# Patient Record
Sex: Female | Born: 1995 | Race: White | Hispanic: No | Marital: Married | Smoking: Never smoker
Health system: Southern US, Community
[De-identification: ages and names within clinical notes are randomized; demographics above are authoritative.]

## PROBLEM LIST (undated history)

## (undated) DIAGNOSIS — F4310 Post-traumatic stress disorder, unspecified: Secondary | ICD-10-CM

## (undated) DIAGNOSIS — N83209 Unspecified ovarian cyst, unspecified side: Secondary | ICD-10-CM

## (undated) HISTORY — DX: Post-traumatic stress disorder, unspecified: F43.10

## (undated) HISTORY — PX: KNEE ARTHROSCOPY WITH ANTERIOR CRUCIATE LIGAMENT (ACL) REPAIR WITH HAMSTRING GRAFT: SHX5645

## (undated) HISTORY — PX: OTHER SURGICAL HISTORY: SHX169

---

## 2018-11-29 ENCOUNTER — Emergency Department
Admission: EM | Admit: 2018-11-29 | Discharge: 2018-11-29 | Disposition: A | Discharge status: Home / Self Care | Payer: BC Managed Care – PPO | Attending: Emergency Medicine | Admitting: Emergency Medicine

## 2018-11-29 ENCOUNTER — Emergency Department: Payer: BC Managed Care – PPO

## 2018-11-29 ENCOUNTER — Other Ambulatory Visit: Payer: Self-pay

## 2018-11-29 ENCOUNTER — Encounter: Payer: Self-pay | Admitting: Emergency Medicine

## 2018-11-29 DIAGNOSIS — O9989 Other specified diseases and conditions complicating pregnancy, childbirth and the puerperium: Secondary | ICD-10-CM | POA: Diagnosis not present

## 2018-11-29 DIAGNOSIS — N83202 Unspecified ovarian cyst, left side: Secondary | ICD-10-CM | POA: Insufficient documentation

## 2018-11-29 DIAGNOSIS — O200 Threatened abortion: Secondary | ICD-10-CM | POA: Diagnosis not present

## 2018-11-29 DIAGNOSIS — R109 Unspecified abdominal pain: Secondary | ICD-10-CM | POA: Insufficient documentation

## 2018-11-29 DIAGNOSIS — R102 Pelvic and perineal pain: Secondary | ICD-10-CM | POA: Diagnosis not present

## 2018-11-29 DIAGNOSIS — O3481 Maternal care for other abnormalities of pelvic organs, first trimester: Secondary | ICD-10-CM | POA: Diagnosis not present

## 2018-11-29 DIAGNOSIS — Z3A01 Less than 8 weeks gestation of pregnancy: Secondary | ICD-10-CM | POA: Diagnosis not present

## 2018-11-29 HISTORY — DX: Unspecified ovarian cyst, unspecified side: N83.209

## 2018-11-29 LAB — URINALYSIS, COMPLETE (UACMP) WITH MICROSCOPIC
Bacteria, UA: NONE SEEN
Bilirubin Urine: NEGATIVE
Glucose, UA: NEGATIVE mg/dL
Hgb urine dipstick: NEGATIVE
Ketones, ur: NEGATIVE mg/dL
Leukocytes,Ua: NEGATIVE
Nitrite: NEGATIVE
Protein, ur: 30 mg/dL — AB
Specific Gravity, Urine: 1.028 (ref 1.005–1.030)
pH: 7 (ref 5.0–8.0)

## 2018-11-29 LAB — COMPREHENSIVE METABOLIC PANEL
ALT: 17 U/L (ref 0–44)
AST: 19 U/L (ref 15–41)
Albumin: 4.3 g/dL (ref 3.5–5.0)
Alkaline Phosphatase: 69 U/L (ref 38–126)
Anion gap: 10 (ref 5–15)
BUN: 12 mg/dL (ref 6–20)
CO2: 24 mmol/L (ref 22–32)
Calcium: 9 mg/dL (ref 8.9–10.3)
Chloride: 103 mmol/L (ref 98–111)
Creatinine, Ser: 0.68 mg/dL (ref 0.44–1.00)
GFR calc Af Amer: >60 mL/min (ref >60)
GFR calc non Af Amer: >60 mL/min (ref >60)
Glucose, Bld: 101 mg/dL — ABNORMAL HIGH (ref 70–99)
Potassium: 4 mmol/L (ref 3.5–5.1)
Sodium: 137 mmol/L (ref 135–145)
Total Bilirubin: 1.1 mg/dL (ref 0.3–1.2)
Total Protein: 7.7 g/dL (ref 6.5–8.1)

## 2018-11-29 LAB — LIPASE, BLOOD: Lipase: 21 U/L (ref 11–51)

## 2018-11-29 LAB — CBC
HCT: 44.5 % (ref 36.0–46.0)
Hemoglobin: 14.6 g/dL (ref 12.0–15.0)
MCH: 26.9 pg (ref 26.0–34.0)
MCHC: 32.8 g/dL (ref 30.0–36.0)
MCV: 82 fL (ref 80.0–100.0)
Platelets: 280 10*3/uL (ref 150–400)
RBC: 5.43 MIL/uL — ABNORMAL HIGH (ref 3.87–5.11)
RDW: 12.1 % (ref 11.5–15.5)
WBC: 6.8 10*3/uL (ref 4.0–10.5)
nRBC: 0 % (ref 0.0–0.2)

## 2018-11-29 LAB — HCG, QUANTITATIVE, PREGNANCY: hCG, Beta Chain, Quant, S: 12 m[IU]/mL — ABNORMAL HIGH (ref <5)

## 2018-11-29 LAB — POCT PREGNANCY, URINE: Preg Test, Ur: NEGATIVE

## 2018-11-29 NOTE — ED Provider Notes (Signed)
[facility] Emergency Department Provider Note  ____________________________________________  Time seen: Approximately 5:08 PM  I have reviewed the triage vital signs and the nursing notes.   HISTORY  Chief Complaint Abdominal Pain   HPI Sarah Nichols is a 23 y.o. female no significant past medical history who presents for evaluation of abdominal pain.  Patient reports a mild dull left-sided abdominal pain that started yesterday.  Patient reports being currently [redacted] weeks pregnant.  She reports that she was not concerned about the pain but because she was pregnant and had not had an ultrasound she thought it was better to get it checked out.  She went to urgent care and was sent to the emergency room for further evaluation.  She denies dysuria, hematuria, fever, chills, cough, shortness of breath, chest pain, diarrhea, constipation, nausea or vomiting.   Past Medical History:  Diagnosis Date   Ovarian cyst     Allergies Patient has no known allergies.  No family history on file.  Social History Social History   Tobacco Use   Smoking status: Never Smoker   Smokeless tobacco: Never Used  Substance Use Topics   Alcohol use: Yes   Drug use: Not on file    Review of Systems  Constitutional: Negative for fever. Eyes: Negative for visual changes. ENT: Negative for sore throat. Neck: No neck pain  Cardiovascular: Negative for chest pain. Respiratory: Negative for shortness of breath. Gastrointestinal: + L sided abdominal pain, vomiting or diarrhea. Genitourinary: Negative for dysuria. Musculoskeletal: Negative for back pain. Skin: Negative for rash. Neurological: Negative for headaches, weakness or numbness. Psych: No SI or HI  ____________________________________________   PHYSICAL EXAM:  VITAL SIGNS: ED Triage Vitals  Enc Vitals Group     BP 11/29/18 1137 122/86     Pulse Rate 11/29/18 1137 77     Resp 11/29/18 1638 18      Temp 11/29/18 1137 98.1 F (36.7 C)     Temp Source 11/29/18 1137 Oral     SpO2 11/29/18 1137 97 %     Weight 11/29/18 1138 205 lb (93 kg)     Height 11/29/18 1138 5\' 4"  (1.626 m)     Head Circumference --      Peak Flow --      Pain Score 11/29/18 1138 4     Pain Loc --      Pain Edu? --      Excl. in GC? --     Constitutional: Alert and oriented. Well appearing and in no apparent distress. HEENT:      Head: Normocephalic and atraumatic.         Eyes: Conjunctivae are normal. Sclera is non-icteric.       Mouth/Throat: Mucous membranes are moist.       Neck: Supple with no signs of meningismus. Cardiovascular: Regular rate and rhythm. No murmurs, gallops, or rubs. 2+ symmetrical distal pulses are present in all extremities. No JVD. Respiratory: Normal respiratory effort. Lungs are clear to auscultation bilaterally. No wheezes, crackles, or rhonchi.  Gastrointestinal: Soft, non tender, and non distended with positive bowel sounds. No rebound or guarding. Genitourinary: No CVA tenderness. Musculoskeletal: Nontender with normal range of motion in all extremities. No edema, cyanosis, or erythema of extremities. Neurologic: Normal speech and language. Face is symmetric. Moving all extremities. No gross focal neurologic deficits are appreciated. Skin: Skin is warm, dry and intact. No rash noted. Psychiatric: Mood and affect are normal. Speech and behavior are normal.  ____________________________________________   LABS (all labs ordered are listed, but only abnormal results are displayed)  Labs Reviewed  COMPREHENSIVE METABOLIC PANEL - Abnormal; Notable for the following components:      Result Value   Glucose, Bld 101 (*)    All other components within normal limits  CBC - Abnormal; Notable for the following components:   RBC 5.43 (*)    All other components within normal limits  URINALYSIS, COMPLETE (UACMP) WITH MICROSCOPIC - Abnormal; Notable for the following components:     Color, Urine YELLOW (*)    APPearance CLEAR (*)    Protein, ur 30 (*)    All other components within normal limits  HCG, QUANTITATIVE, PREGNANCY - Abnormal; Notable for the following components:   hCG, Beta Chain, Quant, S 12 (*)    All other components within normal limits  LIPASE, BLOOD  POCT PREGNANCY, URINE  POC URINE PREG, ED   ____________________________________________  EKG  none  ____________________________________________  RADIOLOGY  I have personally reviewed the images performed during this visit and I agree with the Radiologist's read.   Interpretation by Radiologist:  [place] Ob Comp Less 14 Wks  Result Date: 11/29/2018 CLINICAL DATA:  Abdominal pain in first trimester of pregnancy, pain for 1 day; quantitative beta HCG = 12, with LMP 10/25/2018 EXAM: OBSTETRIC <14 WK US AND TRANSVAGINAL OB US TECHNIQUE: Both transabdominal and transvaginal ultrasound examinations were performed for complete evaluation of the gestation as well as the maternal uterus, adnexal regions, and pelvic cul-de-sac. Transvaginal technique was performed to assess early pregnancy. COMPARISON:  None for this gestation FINDINGS: Intrauterine gestational sac: Absent Yolk sac:  N/A Embryo:  N/A Cardiac Activity: N/A Heart Rate: N/A  bpm MSD:   mm    w     d CRL:    mm    w    d                  US EDC: Subchorionic hemorrhage:  N/A Maternal uterus/adnexae: RIGHT ovary normal size and morphology 2.8 x 1.4 x 1.9 cm LEFT ovary normal size and morphology 3.6 x 2.32.0 cm Small cyst identified within LEFT adnexa adjacent to LEFT ovary, question exophytic LEFT ovarian versus paraovarian cyst 8 x 8 x 8 mm containing a thin partial septation. Uterus normal appearance with endometrial complex 7 mm thick. No free fluid or additional adnexal masses. IMPRESSION: Question small exophytic LEFT ovarian versus paraovarian cyst 8 mm diameter. No intrauterine gestation identified. Findings are compatible with pregnancy of unknown  location. Differential diagnosis includes early intrauterine pregnancy too early to visualize, spontaneous abortion, and ectopic pregnancy. Serial quantitative beta hCG and or followup ultrasound recommended to definitively exclude ectopic pregnancy. Electronically Signed   By: Ulyses SouthwardMark  Boles M.D.   On: 11/29/2018 18:59   [place] Ob Transvaginal  Result Date: 11/29/2018 CLINICAL DATA:  Abdominal pain in first trimester of pregnancy, pain for 1 day; quantitative beta HCG = 12, with LMP 10/25/2018 EXAM: OBSTETRIC <14 WK US AND TRANSVAGINAL OB US TECHNIQUE: Both transabdominal and transvaginal ultrasound examinations were performed for complete evaluation of the gestation as well as the maternal uterus, adnexal regions, and pelvic cul-de-sac. Transvaginal technique was performed to assess early pregnancy. COMPARISON:  None for this gestation FINDINGS: Intrauterine gestational sac: Absent Yolk sac:  N/A Embryo:  N/A Cardiac Activity: N/A Heart Rate: N/A  bpm MSD:   mm    w     d CRL:    mm  w    d                  [place] EDC: Subchorionic hemorrhage:  N/A Maternal uterus/adnexae: RIGHT ovary normal size and morphology 2.8 x 1.4 x 1.9 cm LEFT ovary normal size and morphology 3.6 x 2.32.0 cm Small cyst identified within LEFT adnexa adjacent to LEFT ovary, question exophytic LEFT ovarian versus paraovarian cyst 8 x 8 x 8 mm containing a thin partial septation. Uterus normal appearance with endometrial complex 7 mm thick. No free fluid or additional adnexal masses. IMPRESSION: Question small exophytic LEFT ovarian versus paraovarian cyst 8 mm diameter. No intrauterine gestation identified. Findings are compatible with pregnancy of unknown location. Differential diagnosis includes early intrauterine pregnancy too early to visualize, spontaneous abortion, and ectopic pregnancy. Serial quantitative beta hCG and or followup ultrasound recommended to definitively exclude ectopic pregnancy. Electronically Signed   By: Ulyses Southward M.D.    On: 11/29/2018 18:59    ____________________________________________   PROCEDURES  Procedure(s) performed: None Procedures Critical Care performed:  None ____________________________________________   INITIAL IMPRESSION / ASSESSMENT AND PLAN / ED COURSE   23 y.o. female no significant past medical history who presents for evaluation of left sided mild abdominal soreness since last night.  No other associated symptoms.  Patient reports that she was concerned because she is [redacted] weeks pregnant and has not established care for this pregnancy yet.  This is her first pregnancy.  LMP was 5 weeks ago.  Here urine pregnancy test is negative.  We will send an hCG.  Labs are within normal limits, UA is clear, abdomen is soft with no tenderness at this time.  With no tenderness and normal labs low suspicion for appendicitis, ovarian pathology, gallbladder.  Clinical Course as of Nov 29 1907  Fri Nov 29, 2018  1906 hCG hormone of 12.  Transvaginal ultrasound shows no IUP.  I explained to the patient that she either had a miscarriage versus very early pregnancy of unknown location.  Therefore I explained to her that she needs repeat hCG hormones in 48 hours to determine if patient had a miscarriage or if she needs serial ultrasounds to make sure that this is a IUP versus an ectopic pregnancy.  Patient understands these recommendations.  Ultrasound also showed a left ovarian cyst which is known to patient with no free fluid or signs of torsion.  Patient remains with no tenderness on her abdomen and very mild pain.  Discussed my standard return precautions and close follow-up with.  Patient follows up at [facility].   [CV]    Clinical Course User Index [CV] Don Perking [place], MD     As part of my medical decision making, I reviewed the following data within the electronic MEDICAL RECORD NUMBER Nursing notes reviewed and incorporated, Labs reviewed , Old chart reviewed, Radiograph reviewed , Notes from  prior ED visits and [place] Controlled Substance Database    Pertinent labs & imaging results that were available during my care of the patient were reviewed by me and considered in my medical decision making (see chart for details).    ____________________________________________   FINAL CLINICAL IMPRESSION(S) / ED DIAGNOSES  Final diagnoses:  Threatened miscarriage      NEW MEDICATIONS STARTED DURING THIS VISIT:  ED Discharge Orders    None       Note:  This document was prepared using Dragon voice recognition software and may include unintentional dictation errors.    Don Perking, [place], MD 11/29/18  1909 ° °

## 2018-11-29 NOTE — ED Notes (Signed)
Report from jennifer, rn.  

## 2018-11-29 NOTE — Discharge Instructions (Addendum)
As explained to you, your ultrasound was unable to locate a pregnancy.  This is either because you had a miscarriage or your pregnancy is too early to be seen.  At this time we are unable to determine which 1 of the 2.  Therefore it is important they follow-up with your OB/GYN on Monday for repeat pregnancy hormones.  If the hormone level is 0 that means you had a miscarriage.  If it is going up you will need serial ultrasounds to make sure that you do not have an ectopic pregnancy.  Return to the emergency room if you have severe or new abdominal pain.  Otherwise follow-up with your OB/GYN on Monday.

## 2018-11-29 NOTE — ED Triage Notes (Signed)
Pt with left side abd pain that started last night. Pain described as achy. Pt states that nothing makes pain better or worse. Pt also approx [redacted] weeks pregnant, first pregnancy.

## 2019-02-11 ENCOUNTER — Telehealth: Payer: Self-pay

## 2019-02-11 NOTE — Telephone Encounter (Signed)
Coronavirus (COVID-19) Are you at risk?  Are you at risk for the Coronavirus (COVID-19)?  To be considered HIGH RISK for Coronavirus (COVID-19), you have to meet the following criteria:  . Traveled to [place], [place], [place], [place] or [place]; or in the [place] to [place], [place], [place], or [place]; and have fever, cough, and shortness of breath within the last 2 weeks of travel OR . Been in close contact with a person diagnosed with COVID-19 within the last 2 weeks and have fever, cough, and shortness of breath . IF YOU DO NOT MEET THESE CRITERIA, YOU ARE CONSIDERED LOW RISK FOR COVID-19.  What to do if you are HIGH RISK for COVID-19?  . If you are having a medical emergency, call 911. . Seek medical care right away. Before you go to a doctor's office, urgent care or emergency department, call ahead and tell them about your recent travel, contact with someone diagnosed with COVID-19, and your symptoms. You should receive instructions from your physician's office regarding next steps of care.  . When you arrive at healthcare provider, tell the healthcare staff immediately you have returned from visiting [place], [place], [place], [place] or [place]; or traveled in the [place] to [place], [place], [place], or [place]; in the last two weeks or you have been in close contact with a person diagnosed with COVID-19 in the last 2 weeks.   . Tell the health care staff about your symptoms: fever, cough and shortness of breath. . After you have been seen by a medical provider, you will be either: o Tested for (COVID-19) and discharged home on quarantine except to seek medical care if symptoms worsen, and asked to  - Stay home and avoid contact with others until you get your results (4-5 days)  - Avoid travel on public transportation if possible (such as bus, train, or airplane) or o Sent to the Emergency Department by EMS for evaluation, COVID-19 testing, and possible  admission depending on your condition and test results.  What to do if you are LOW RISK for COVID-19?  Reduce your risk of any infection by using the same precautions used for avoiding the common cold or flu:  . Wash your hands often with soap and warm water for at least 20 seconds.  If soap and water are not readily available, use an alcohol-based hand sanitizer with at least 60% alcohol.  . If coughing or sneezing, cover your mouth and nose by coughing or sneezing into the elbow areas of your shirt or coat, into a tissue or into your sleeve (not your hands). . Avoid shaking hands with others and consider head nods or verbal greetings only. . Avoid touching your eyes, nose, or mouth with unwashed hands.  . Avoid close contact with people who are Harriett Azar. . Avoid places or events with large numbers of people in one location, like concerts or sporting events. . Carefully consider travel plans you have or are making. . If you are planning any travel outside or inside the [place], visit the CDC's Travelers' Health webpage for the latest health notices. . If you have some symptoms but not all symptoms, continue to monitor at home and seek medical attention if your symptoms worsen. . If you are having a medical emergency, call 911.  02/11/19 SCREENING NEG SLS ADDITIONAL HEALTHCARE OPTIONS FOR PATIENTS  [facility] / e-Visit: [email or web address]         [facility]: [phone]    [facility]: [phone]                   [facility]: [phone]  

## 2019-02-12 ENCOUNTER — Encounter: Payer: Self-pay | Admitting: Certified Nurse Midwife

## 2019-02-12 ENCOUNTER — Other Ambulatory Visit: Payer: Self-pay

## 2019-02-12 ENCOUNTER — Ambulatory Visit: Payer: BC Managed Care – PPO | Admitting: Certified Nurse Midwife

## 2019-02-12 ENCOUNTER — Telehealth: Payer: Self-pay

## 2019-02-12 VITALS — BP 118/78 | HR 91 | Ht 64.0 in | Wt 206.3 lb

## 2019-02-12 DIAGNOSIS — N912 Amenorrhea, unspecified: Secondary | ICD-10-CM | POA: Diagnosis not present

## 2019-02-12 DIAGNOSIS — O09299 Supervision of pregnancy with other poor reproductive or obstetric history, unspecified trimester: Secondary | ICD-10-CM | POA: Diagnosis not present

## 2019-02-12 LAB — POCT URINE PREGNANCY: Preg Test, Ur: POSITIVE — AB

## 2019-02-12 MED ORDER — BONJESTA 20-20 MG PO TBCR: 1.0000 | EXTENDED_RELEASE_TABLET | Freq: Two times a day (BID) | ORAL | 4 refills | Status: DC

## 2019-02-12 NOTE — Telephone Encounter (Signed)
Informed patient her insurance company rejected the Center. She opted for the Vit B6 and Unisom to try.

## 2019-02-12 NOTE — Progress Notes (Signed)
Subjective:    Sarah Nichols is a 23 y.o. female who presents for evaluation of amenorrhea. She believes she could be pregnant. Pregnancy is desired. Sexual Activity: single partner, contraception: none. Current symptoms also include: breast tenderness and fatigue. Last period was normal.   Patient's last menstrual period was 12/28/2018 (exact date). The following portions of the patient's history were reviewed and updated as appropriate: allergies, current medications, past family history, past medical history, past social history, past surgical history and problem list.  Review of Systems Pertinent items are noted in HPI.     Objective:    BP 118/78   Pulse 91   Ht 5\' 4"  (1.626 m)   Wt 206 lb 5 oz (93.6 kg)   LMP 12/28/2018 (Exact Date)   BMI 35.41 kg/m  General: alert, cooperative, appears stated age and no acute distress    Lab Review Urine HCG: positive    Assessment:    Absence of menstruation.     Plan:   Positive: EDC: 10/04/19 Briefly discussed pre-natal care options. Pregnancy, Childbirth and Midwifery or MD care. Encouraged well-balanced diet, plenty of rest when needed, pre-natal vitamins daily and walking for exercise. Discussed self-help for nausea, avoiding OTC medications until consulting provider or pharmacist, other than Tylenol as needed, minimal caffeine (1-2 cups daily) and avoiding alcohol. She will schedule her u/s for dating 1 wk,  Initial nurse visit @ 10 wks and her NOB visit @ 12 wks .   Philip Aspen, CNM

## 2019-02-12 NOTE — Patient Instructions (Signed)

## 2019-02-26 ENCOUNTER — Other Ambulatory Visit: Payer: Self-pay

## 2019-02-26 ENCOUNTER — Ambulatory Visit (INDEPENDENT_AMBULATORY_CARE_PROVIDER_SITE_OTHER): Payer: BC Managed Care – PPO

## 2019-02-26 DIAGNOSIS — O3481 Maternal care for other abnormalities of pelvic organs, first trimester: Secondary | ICD-10-CM

## 2019-02-26 DIAGNOSIS — N8312 Corpus luteum cyst of left ovary: Secondary | ICD-10-CM | POA: Diagnosis not present

## 2019-02-26 DIAGNOSIS — Z3A01 Less than 8 weeks gestation of pregnancy: Secondary | ICD-10-CM

## 2019-02-26 DIAGNOSIS — N912 Amenorrhea, unspecified: Secondary | ICD-10-CM

## 2019-03-13 ENCOUNTER — Telehealth: Payer: Self-pay

## 2019-03-13 NOTE — Telephone Encounter (Signed)
Called pt advised her to put a pad on and let us know if bleeding continues

## 2019-03-13 NOTE — Telephone Encounter (Signed)
Patient used bathroom this morning and when she wiped she had light bleeding. [redacted] weeks pregnant concerned. No pain and no abdominal pressure or other symptoms. Please call her to Advise.

## 2019-03-14 ENCOUNTER — Ambulatory Visit (INDEPENDENT_AMBULATORY_CARE_PROVIDER_SITE_OTHER): Payer: BC Managed Care – PPO | Admitting: Obstetrics and Gynecology

## 2019-03-14 ENCOUNTER — Other Ambulatory Visit: Payer: Self-pay

## 2019-03-14 VITALS — BP 120/87 | HR 80 | Ht 64.0 in | Wt 207.8 lb

## 2019-03-14 DIAGNOSIS — Z3481 Encounter for supervision of other normal pregnancy, first trimester: Secondary | ICD-10-CM

## 2019-03-14 DIAGNOSIS — Z113 Encounter for screening for infections with a predominantly sexual mode of transmission: Secondary | ICD-10-CM

## 2019-03-14 DIAGNOSIS — Z0283 Encounter for blood-alcohol and blood-drug test: Secondary | ICD-10-CM

## 2019-03-14 DIAGNOSIS — R638 Other symptoms and signs concerning food and fluid intake: Secondary | ICD-10-CM

## 2019-03-14 NOTE — Progress Notes (Signed)
      Sarah Nichols presents for NOB nurse intake visit. Pregnancy confirmation done at [facility], 02/12/19, with Philip Aspen.  G2.  P0010.  LMP 12/28/18.  EDD 10/16/19.  [place]  [redacted]w[redacted]d . Pregnancy education material explained and given. 0 cats in the home.  NOB labs ordered. BMI greater than 30. TSH/HbgA1c ordered. Sickle cell not order due to race. HIV and drug screen explained and ordered. Genetic screening discussed. Genetic testing; Unsure. Pt to discuss genetic testing with provider. PNV encouraged. Pt to follow up with provider in  3 weeks for NOB physical. FMLA and [place] reviewed and signed.   Pt requested to see a MD instead of a midwife.  BP 120/87   Pulse 80   Ht 5\' 4"  (1.626 m)   Wt 207 lb 12.8 oz (94.3 kg)   LMP 12/28/2018 (Exact Date)   BMI 35.67 kg/m

## 2019-03-14 NOTE — Patient Instructions (Signed)
WHAT OB PATIENTS CAN EXPECT   Confirmation of pregnancy and ultrasound ordered if medically indicated-[redacted] weeks gestation  New OB (NOB) intake with nurse and New OB (NOB) labs- [redacted] weeks gestation  New OB (NOB) physical examination with provider- 11/[redacted] weeks gestation  Flu vaccine-[redacted] weeks gestation  Anatomy scan-[redacted] weeks gestation  Glucose tolerance test, blood work to test for anemia, T-dap vaccine-[redacted] weeks gestation  Vaginal swabs/cultures-STD/Group B strep-[redacted] weeks gestation  Appointments every 4 weeks until 28 weeks  Every 2 weeks from 28 weeks until 36 weeks  Weekly visits from 36 weeks until delivery  Morning Sickness  Morning sickness is when you feel sick to your stomach (nauseous) during pregnancy. You may feel sick to your stomach and throw up (vomit). You may feel sick in the morning, but you can feel this way at any time of day. Some women feel very sick to their stomach and cannot stop throwing up (hyperemesis gravidarum). Follow these instructions at home: Medicines  Take over-the-counter and prescription medicines only as told by your doctor. Do not take any medicines until you talk with your doctor about them first.  Taking multivitamins before getting pregnant can stop or lessen the harshness of morning sickness. Eating and drinking  Eat dry toast or crackers before getting out of bed.  Eat 5 or 6 small meals a day.  Eat dry and bland foods like rice and baked potatoes.  Do not eat greasy, fatty, or spicy foods.  Have someone cook for you if the smell of food causes you to feel sick or throw up.  If you feel sick to your stomach after taking prenatal vitamins, take them at night or with a snack.  Eat protein when you need a snack. Nuts, yogurt, and cheese are good choices.  Drink fluids throughout the day.  Try ginger ale made with real ginger, ginger tea made from fresh grated ginger, or ginger candies. General instructions  Do not use any products  that have nicotine or tobacco in them, such as cigarettes and e-cigarettes. If you need help quitting, ask your doctor.  Use an air purifier to keep the air in your house free of smells.  Get lots of fresh air.  Try to avoid smells that make you feel sick.  Try: ? Wearing a bracelet that is used for seasickness (acupressure wristband). ? Going to a doctor who puts thin needles into certain body points (acupuncture) to improve how you feel. Contact a doctor if:  You need medicine to feel better.  You feel dizzy or light-headed.  You are losing weight. Get help right away if:  You feel very sick to your stomach and cannot stop throwing up.  You pass out (faint).  You have very bad pain in your belly. Summary  Morning sickness is when you feel sick to your stomach (nauseous) during pregnancy.  You may feel sick in the morning, but you can feel this way at any time of day.  Making some changes to what you eat may help your symptoms go away. This information is not intended to replace advice given to you by your health care provider. Make sure you discuss any questions you have with your health care provider. Document Released: 08/24/2004 Document Revised: 06/29/2017 Document Reviewed: 08/17/2016 Elsevier Patient Education  2020 [employer]. How a Baby Grows During Pregnancy  Pregnancy begins when a female's sperm enters a female's egg (fertilization). Fertilization usually happens in one of the tubes (fallopian tubes) that connect the  ovaries to the womb (uterus). The fertilized egg moves down the fallopian tube to the uterus. Once it reaches the uterus, it implants into the lining of the uterus and begins to grow. For the first 10 weeks, the fertilized egg is called an embryo. After 10 weeks, it is called a fetus. As the fetus continues to grow, it receives oxygen and nutrients through tissue (placenta) that grows to support the developing baby. The placenta is the life support  system for the baby. It provides oxygen and nutrition and removes waste. Learning as much as you can about your pregnancy and how your baby is developing can help you enjoy the experience. It can also make you aware of when there might be a problem and when to ask questions. How long does a typical pregnancy last? A pregnancy usually lasts 280 days, or about 40 weeks. Pregnancy is divided into three periods of growth, also called trimesters:  First trimester: 0-12 weeks.  Second trimester: 13-27 weeks.  Third trimester: 28-40 weeks. The day when your baby is ready to be born (full term) is your estimated date of delivery. How does my baby develop month by month? First month  The fertilized egg attaches to the inside of the uterus.  Some cells will form the placenta. Others will form the fetus.  The arms, legs, brain, spinal cord, lungs, and heart begin to develop.  At the end of the first month, the heart begins to beat. Second month  The bones, inner ear, eyelids, hands, and feet form.  The genitals develop.  By the end of 8 weeks, all major organs are developing. Third month  All of the internal organs are forming.  Teeth develop below the gums.  Bones and muscles begin to grow. The spine can flex.  The skin is transparent.  Fingernails and toenails begin to form.  Arms and legs continue to grow longer, and hands and feet develop.  The fetus is about 3 inches (7.6 cm) long. Fourth month  The placenta is completely formed.  The external sex organs, neck, outer ear, eyebrows, eyelids, and fingernails are formed.  The fetus can hear, swallow, and move its arms and legs.  The kidneys begin to produce urine.  The skin is covered with a white, waxy coating (vernix) and very fine hair (lanugo). Fifth month  The fetus moves around more and can be felt for the first time (quickening).  The fetus starts to sleep and wake up and may begin to suck its finger.  The  nails grow to the end of the fingers.  The organ in the digestive system that makes bile (gallbladder) functions and helps to digest nutrients.  If your baby is a girl, eggs are present in her ovaries. If your baby is a boy, testicles start to move down into his scrotum. Sixth month  The lungs are formed.  The eyes open. The brain continues to develop.  Your baby has fingerprints and toe prints. Your baby's hair grows thicker.  At the end of the second trimester, the fetus is about 9 inches (22.9 cm) long. Seventh month  The fetus kicks and stretches.  The eyes are developed enough to sense changes in light.  The hands can make a grasping motion.  The fetus responds to sound. Eighth month  All organs and body systems are fully developed and functioning.  Bones harden, and taste buds develop. The fetus may hiccup.  Certain areas of the brain are still developing. The  skull remains soft. Ninth month  The fetus gains about  lb (0.23 kg) each week.  The lungs are fully developed.  Patterns of sleep develop.  The fetus's head typically moves into a head-down position (vertex) in the uterus to prepare for birth.  The fetus weighs 6-9 lb (2.72-4.08 kg) and is 19-20 inches (48.26-50.8 cm) long. What can I do to have a healthy pregnancy and help my baby develop? General instructions  Take prenatal vitamins as directed by your health care provider. These include vitamins such as folic acid, iron, calcium, and vitamin D. They are important for healthy development.  Take medicines only as directed by your health care provider. Read labels and ask a pharmacist or your health care provider whether over-the-counter medicines, supplements, and prescription drugs are safe to take during pregnancy.  Keep all follow-up visits as directed by your health care provider. This is important. Follow-up visits include prenatal care and screening tests. How do I know if my baby is developing  well? At each prenatal visit, your health care provider will do several different tests to check on your health and keep track of your baby's development. These include:  Fundal height and position. ? Your health care provider will measure your growing belly from your pubic bone to the top of the uterus using a tape measure. ? Your health care provider will also feel your belly to determine your baby's position.  Heartbeat. ? An ultrasound in the first trimester can confirm pregnancy and show a heartbeat, depending on how far along you are. ? Your health care provider will check your baby's heart rate at every prenatal visit.  Second trimester ultrasound. ? This ultrasound checks your baby's development. It also may show your baby's gender. What should I do if I have concerns about my baby's development? Always talk with your health care provider about any concerns that you may have about your pregnancy and your baby. Summary  A pregnancy usually lasts 280 days, or about 40 weeks. Pregnancy is divided into three periods of growth, also called trimesters.  Your health care provider will monitor your baby's growth and development throughout your pregnancy.  Follow your health care provider's recommendations about taking prenatal vitamins and medicines during your pregnancy.  Talk with your health care provider if you have any concerns about your pregnancy or your developing baby. This information is not intended to replace advice given to you by your health care provider. Make sure you discuss any questions you have with your health care provider. Document Released: 01/03/2008 Document Revised: 11/07/2018 Document Reviewed: 05/30/2017 Elsevier Patient Education  2020 [employer] of Pregnancy  The first trimester of pregnancy is from week 1 until the end of week 13 (months 1 through 3). During this time, your baby will begin to develop inside you. At 6-8 weeks, the eyes  and face are formed, and the heartbeat can be seen on ultrasound. At the end of 12 weeks, all the baby's organs are formed. Prenatal care is all the medical care you receive before the birth of your baby. Make sure you get good prenatal care and follow all of your doctor's instructions. Follow these instructions at home: Medicines  Take over-the-counter and prescription medicines only as told by your doctor. Some medicines are safe and some medicines are not safe during pregnancy.  Take a prenatal vitamin that contains at least 600 micrograms (mcg) of folic acid.  If you have trouble pooping (constipation), take medicine that  will make your stool soft (stool softener) if your doctor approves. Eating and drinking   Eat regular, healthy meals.  Your doctor will tell you the amount of weight gain that is right for you.  Avoid raw meat and uncooked cheese.  If you feel sick to your stomach (nauseous) or throw up (vomit): ? Eat 4 or 5 small meals a day instead of 3 large meals. ? Try eating a few soda crackers. ? Drink liquids between meals instead of during meals.  To prevent constipation: ? Eat foods that are high in fiber, like fresh fruits and vegetables, whole grains, and beans. ? Drink enough fluids to keep your pee (urine) clear or pale yellow. Activity  Exercise only as told by your doctor. Stop exercising if you have cramps or pain in your lower belly (abdomen) or low back.  Do not exercise if it is too hot, too humid, or if you are in a place of great height (high altitude).  Try to avoid standing for long periods of time. Move your legs often if you must stand in one place for a long time.  Avoid heavy lifting.  Wear low-heeled shoes. Sit and stand up straight.  You can have sex unless your doctor tells you not to. Relieving pain and discomfort  Wear a good support bra if your breasts are sore.  Take warm water baths (sitz baths) to soothe pain or discomfort caused by  hemorrhoids. Use hemorrhoid cream if your doctor says it is okay.  Rest with your legs raised if you have leg cramps or low back pain.  If you have puffy, bulging veins (varicose veins) in your legs: ? Wear support hose or compression stockings as told by your doctor. ? Raise (elevate) your feet for 15 minutes, 3-4 times a day. ? Limit salt in your food. Prenatal care  Schedule your prenatal visits by the twelfth week of pregnancy.  Write down your questions. Take them to your prenatal visits.  Keep all your prenatal visits as told by your doctor. This is important. Safety  Wear your seat belt at all times when driving.  Make a list of emergency phone numbers. The list should include numbers for family, friends, the hospital, and police and fire departments. General instructions  Ask your doctor for a referral to a local prenatal class. Begin classes no later than at the start of month 6 of your pregnancy.  Ask for help if you need counseling or if you need help with nutrition. Your doctor can give you advice or tell you where to go for help.  Do not use hot tubs, steam rooms, or saunas.  Do not douche or use tampons or scented sanitary pads.  Do not cross your legs for long periods of time.  Avoid all herbs and alcohol. Avoid drugs that are not approved by your doctor.  Do not use any tobacco products, including cigarettes, chewing tobacco, and electronic cigarettes. If you need help quitting, ask your doctor. You may get counseling or other support to help you quit.  Avoid cat litter boxes and soil used by cats. These carry germs that can cause birth defects in the baby and can cause a loss of your baby (miscarriage) or stillbirth.  Visit your dentist. At home, brush your teeth with a soft toothbrush. Be gentle when you floss. Contact a doctor if:  You are dizzy.  You have mild cramps or pressure in your lower belly.  You have a nagging pain  in your belly area.  You  continue to feel sick to your stomach, you throw up, or you have watery poop (diarrhea).  You have a bad smelling fluid coming from your vagina.  You have pain when you pee (urinate).  You have increased puffiness (swelling) in your face, hands, legs, or ankles. Get help right away if:  You have a fever.  You are leaking fluid from your vagina.  You have spotting or bleeding from your vagina.  You have very bad belly cramping or pain.  You gain or lose weight rapidly.  You throw up blood. It may look like coffee grounds.  You are around people who have [place] measles, fifth disease, or chickenpox.  You have a very bad headache.  You have shortness of breath.  You have any kind of trauma, such as from a fall or a car accident. Summary  The first trimester of pregnancy is from week 1 until the end of week 13 (months 1 through 3).  To take care of yourself and your unborn baby, you will need to eat healthy meals, take medicines only if your doctor tells you to do so, and do activities that are safe for you and your baby.  Keep all follow-up visits as told by your doctor. This is important as your doctor will have to ensure that your baby is healthy and growing well. This information is not intended to replace advice given to you by your health care provider. Make sure you discuss any questions you have with your health care provider. Document Released: 01/03/2008 Document Revised: 11/07/2018 Document Reviewed: 07/25/2016 Elsevier Patient Education  2020 [employer]. Commonly Asked Questions During Pregnancy  Cats: A parasite can be excreted in cat feces.  To avoid exposure you need to have another person empty the little box.  If you must empty the litter box you will need to wear gloves.  Wash your hands after handling your cat.  This parasite can also be found in raw or undercooked meat so this should also be avoided.  Colds, Sore Throats, Flu: Please check your medication  sheet to see what you can take for symptoms.  If your symptoms are unrelieved by these medications please call the office.  Dental Work: Most any dental work Investment banker, corporate recommends is permitted.  X-rays should only be taken during the first trimester if absolutely necessary.  Your abdomen should be shielded with a lead apron during all x-rays.  Please notify your provider prior to receiving any x-rays.  Novocaine is fine; gas is not recommended.  If your dentist requires a note from [place] prior to dental work please call the office and we will provide one for you.  Exercise: Exercise is an important part of staying healthy during your pregnancy.  You may continue most exercises you were accustomed to prior to pregnancy.  Later in your pregnancy you will most likely notice you have difficulty with activities requiring balance like riding a bicycle.  It is important that you listen to your body and avoid activities that put you at a higher risk of falling.  Adequate rest and staying well hydrated are a must!  If you have questions about the safety of specific activities ask your provider.    Exposure to Children with illness: Try to avoid obvious exposure; report any symptoms to [place] when noted,  If you have chicken pos, red measles or mumps, you should be immune to these diseases.   Please do not  take any vaccines while pregnant unless you have checked with your OB provider.  Fetal Movement: After 28 weeks we recommend you do "kick counts" twice daily.  Lie or sit down in a calm quiet environment and count your baby movements "kicks".  You should feel your baby at least 10 times per hour.  If you have not felt 10 kicks within the first hour get up, walk around and have something sweet to eat or drink then repeat for an additional hour.  If count remains less than 10 per hour notify your provider.  Fumigating: Follow your pest control agent's advice as to how long to stay out of your home.  Ventilate the area  well before re-entering.  Hemorrhoids:   Most over-the-counter preparations can be used during pregnancy.  Check your medication to see what is safe to use.  It is important to use a stool softener or fiber in your diet and to drink lots of liquids.  If hemorrhoids seem to be getting worse please call the office.   Hot Tubs:  Hot tubs Jacuzzis and saunas are not recommended while pregnant.  These increase your internal body temperature and should be avoided.  Intercourse:  Sexual intercourse is safe during pregnancy as long as you are comfortable, unless otherwise advised by your provider.  Spotting may occur after intercourse; report any bright red bleeding that is heavier than spotting.  Labor:  If you know that you are in labor, please go to the hospital.  If you are unsure, please call the office and let us help you decide what to do.  Lifting, straining, etc:  If your job requires heavy lifting or straining please check with your provider for any limitations.  Generally, you should not lift items heavier than that you can lift simply with your hands and arms (no back muscles)  Painting:  Paint fumes do not harm your pregnancy, but may make you ill and should be avoided if possible.  Latex or water based paints have less odor than oils.  Use adequate ventilation while painting.  Permanents & Hair Color:  Chemicals in hair dyes are not recommended as they cause increase hair dryness which can increase hair loss during pregnancy.  " Highlighting" and permanents are allowed.  Dye may be absorbed differently and permanents may not hold as well during pregnancy.  Sunbathing:  Use a sunscreen, as skin burns easily during pregnancy.  Drink plenty of fluids; avoid over heating.  Tanning Beds:  Because their possible side effects are still unknown, tanning beds are not recommended.  Ultrasound Scans:  Routine ultrasounds are performed at approximately 20 weeks.  You will be able to see your baby's  general anatomy an if you would like to know the gender this can usually be determined as well.  If it is questionable when you conceived you may also receive an ultrasound early in your pregnancy for dating purposes.  Otherwise ultrasound exams are not routinely performed unless there is a medical necessity.  Although you can request a scan we ask that you pay for it when conducted because insurance does not cover " patient request" scans.  Work: If your pregnancy proceeds without complications you may work until your due date, unless your physician or employer advises otherwise.  Round Ligament Pain/Pelvic Discomfort:  Sharp, shooting pains not associated with bleeding are fairly common, usually occurring in the second trimester of pregnancy.  They tend to be worse when standing up or when you remain  standing for long periods of time.  These are the result of pressure of certain pelvic ligaments called "round ligaments".  Rest, Tylenol and heat seem to be the most effective relief.  As the womb and fetus grow, they rise out of the pelvis and the discomfort improves.  Please notify the office if your pain seems different than that described.  It may represent a more serious condition.  Common Medications Safe in Pregnancy  Acne:      Constipation:  Benzoyl Peroxide     Colace  Clindamycin      Dulcolax Suppository  Topica Erythromycin     Fibercon  Salicylic Acid      Metamucil         Miralax AVOID:        Senakot   Accutane    Cough:  Retin-A       Cough Drops  Tetracycline      Phenergan w/ Codeine if Rx  Minocycline      Robitussin (Plain & DM)  Antibiotics:     Crabs/Lice:  Ceclor       RID  Cephalosporins    AVOID:  E-Mycins      Kwell  Keflex  Macrobid/Macrodantin   Diarrhea:  Penicillin      Kao-Pectate  Zithromax      Imodium AD         PUSH FLUIDS AVOID:       Cipro     Fever:  Tetracycline      Tylenol (Regular or Extra  Minocycline       Strength)  Levaquin      Extra  Strength-Do not          Exceed 8 tabs/24 hrs Caffeine:        <227m/day (equiv. To 1 cup of coffee or  approx. 3 12 oz sodas)         Gas: Cold/Hayfever:       Gas-X  Benadryl      Mylicon  Claritin       Phazyme  **Claritin-D        Chlor-Trimeton    Headaches:  Dimetapp      ASA-Free Excedrin  Drixoral-Non-Drowsy     Cold Compress  Mucinex (Guaifenasin)     Tylenol (Regular or Extra  Sudafed/Sudafed-12 Hour     Strength)  **Sudafed PE Pseudoephedrine   Tylenol Cold & Sinus     Vicks Vapor Rub  Zyrtec  **AVOID if Problems With Blood Pressure         Heartburn: Avoid lying down for at least 1 hour after meals  Aciphex      Maalox     Rash:  Milk of Magnesia     Benadryl    Mylanta       1% Hydrocortisone Cream  Pepcid  Pepcid Complete   Sleep Aids:  Prevacid      Ambien   Prilosec       Benadryl  Rolaids       Chamomile Tea  Tums (Limit 4/day)     Unisom  Zantac       Tylenol PM         Warm milk-add vanilla or  Hemorrhoids:       Sugar for taste  Anusol/Anusol H.C.  (RX: Analapram 2.5%)  Sugar Substitutes:  Hydrocortisone OTC     Ok in moderation  Preparation H      Tucks        Vaseline lotion  applied to tissue with wiping    Herpes:     Throat:  Acyclovir      Oragel  Famvir  Valtrex     Vaccines:         Flu Shot Leg Cramps:       *Gardasil  Benadryl      Hepatitis A         Hepatitis B Nasal Spray:       Pneumovax  Saline Nasal Spray     Polio Booster         Tetanus Nausea:       Tuberculosis test or PPD  Vitamin B6 25 mg TID   AVOID:    Dramamine      *Gardasil  Emetrol       Live Poliovirus  Ginger Root 250 mg QID    MMR (measles, mumps &  High Complex Carbs @ Bedtime    rebella)  Sea Bands-Accupressure    Varicella (Chickenpox)  Unisom 1/2 tab TID     *No known complications           If received before Pain:         Known pregnancy;   Darvocet       Resume series  after  Lortab        Delivery  Percocet    Yeast:   Tramadol      Femstat  Tylenol 3      Gyne-lotrimin  Ultram       Monistat  Vicodin           MISC:         All Sunscreens           Hair Coloring/highlights          Insect Repellant's          (Including DEET)         Mystic Tans Breast Self-Awareness Breast self-awareness is knowing how your breasts look and feel. Doing breast self-awareness is important. It allows you to catch a breast problem early while it is still small and can be treated. All women should do breast self-awareness, including women who have had breast implants. Tell your doctor if you notice a change in your breasts. What you need:  A mirror.  A well-lit room. How to do a breast self-exam A breast self-exam is one way to learn what is normal for your breasts and to check for changes. To do a breast self-exam: Look for changes  1. Take off all the clothes above your waist. 2. Stand in front of a mirror in a room with good lighting. 3. Put your hands on your hips. 4. Push your hands down. 5. Look at your breasts and nipples in the mirror to see if one breast or nipple looks different from the other. Check to see if: ? The shape of one breast is different. ? The size of one breast is different. ? There are wrinkles, dips, and bumps in one breast and not the other. 6. Look at each breast for changes in the skin, such as: ? Redness. ? Scaly areas. 7. Look for changes in your nipples, such as: ? Liquid around the nipples. ? Bleeding. ? Dimpling. ? Redness. ? A change in where the nipples are. Feel for changes  1. Lie on your back on the floor. 2. Feel each breast. To do this, follow these steps: ? Pick a breast to feel. ? Put the arm closest to that  breast above your head. ? Use your other arm to feel the nipple area of your breast. Feel the area with the pads of your three middle fingers by making small circles with your fingers. For the first circle,  press lightly. For the second circle, press harder. For the third circle, press even harder. ? Keep making circles with your fingers at the different pressures as you move down your breast. Stop when you feel your ribs. ? Move your fingers a little toward the center of your body. ? Start making circles with your fingers again, this time going up until you reach your collarbone. ? Keep making up-and-down circles until you reach your armpit. Remember to keep using the three pressures. ? Feel the other breast in the same way. 3. Sit or stand in the tub or shower. 4. With soapy water on your skin, feel each breast the same way you did in step 2 when you were lying on the floor. Write down what you find Writing down what you find can help you remember what to tell your doctor. Write down:  What is normal for each breast.  Any changes you find in each breast, including: ? The kind of changes you find. ? Whether you have pain. ? Size and location of any lumps.  When you last had your menstrual period. General tips  Check your breasts every month.  If you are breastfeeding, the best time to check your breasts is after you feed your baby or after you use a breast pump.  If you get menstrual periods, the best time to check your breasts is 5-7 days after your menstrual period is over.  With time, you will become comfortable with the self-exam, and you will begin to know if there are changes in your breasts. Contact a doctor if you:  See a change in the shape or size of your breasts or nipples.  See a change in the skin of your breast or nipples, such as red or scaly skin.  Have fluid coming from your nipples that is not normal.  Find a lump or thick area that was not there before.  Have pain in your breasts.  Have any concerns about your breast health. Summary  Breast self-awareness includes looking for changes in your breasts, as well as feeling for changes within your  breasts.  Breast self-awareness should be done in front of a mirror in a well-lit room.  You should check your breasts every month. If you get menstrual periods, the best time to check your breasts is 5-7 days after your menstrual period is over.  Let your doctor know of any changes you see in your breasts, including changes in size, changes on the skin, pain or tenderness, or fluid from your nipples that is not normal. This information is not intended to replace advice given to you by your health care provider. Make sure you discuss any questions you have with your health care provider. Document Released: 01/03/2008 Document Revised: 03/05/2018 Document Reviewed: 03/05/2018 Elsevier Patient Education  2020 [employer].

## 2019-03-17 ENCOUNTER — Other Ambulatory Visit: Payer: BC Managed Care – PPO

## 2019-03-18 ENCOUNTER — Other Ambulatory Visit: Payer: BC Managed Care – PPO

## 2019-03-18 ENCOUNTER — Telehealth: Payer: Self-pay | Admitting: Obstetrics and Gynecology

## 2019-03-18 ENCOUNTER — Other Ambulatory Visit: Payer: Self-pay

## 2019-03-18 NOTE — Telephone Encounter (Signed)
Patient called stating she is experiencing light spotting again this morning. She would like a call back. Thanks

## 2019-03-18 NOTE — Telephone Encounter (Signed)
Pt states she has had spotting on/off since Thursday. Today she had bright bleeding x 1. Has a hx of miscarriage. Pt request to be seen asap.   appt made with DJE 8/19.

## 2019-03-19 ENCOUNTER — Other Ambulatory Visit: Payer: Self-pay

## 2019-03-19 ENCOUNTER — Ambulatory Visit (INDEPENDENT_AMBULATORY_CARE_PROVIDER_SITE_OTHER): Payer: BC Managed Care – PPO | Admitting: Obstetrics and Gynecology

## 2019-03-19 ENCOUNTER — Encounter: Payer: Self-pay | Admitting: Obstetrics and Gynecology

## 2019-03-19 ENCOUNTER — Other Ambulatory Visit (INDEPENDENT_AMBULATORY_CARE_PROVIDER_SITE_OTHER): Payer: BC Managed Care – PPO

## 2019-03-19 VITALS — BP 125/78 | HR 94 | Wt 206.0 lb

## 2019-03-19 DIAGNOSIS — O200 Threatened abortion: Secondary | ICD-10-CM | POA: Diagnosis not present

## 2019-03-19 DIAGNOSIS — Z3A01 Less than 8 weeks gestation of pregnancy: Secondary | ICD-10-CM

## 2019-03-19 DIAGNOSIS — O209 Hemorrhage in early pregnancy, unspecified: Secondary | ICD-10-CM | POA: Diagnosis not present

## 2019-03-19 LAB — DRUG PROFILE, UR, 9 DRUGS (LABCORP)
Amphetamines, Urine: NEGATIVE ng/mL
Barbiturate Quant, Ur: NEGATIVE ng/mL
Benzodiazepine Quant, Ur: NEGATIVE ng/mL
Cannabinoid Quant, Ur: NEGATIVE ng/mL
Cocaine (Metab.): NEGATIVE ng/mL
Methadone Screen, Urine: NEGATIVE ng/mL
Opiate Quant, Ur: NEGATIVE ng/mL
PCP Quant, Ur: NEGATIVE ng/mL
Propoxyphene: NEGATIVE ng/mL

## 2019-03-19 LAB — POCT URINALYSIS DIPSTICK OB
Bilirubin, UA: NEGATIVE
Blood, UA: NEGATIVE
Glucose, UA: NEGATIVE
Ketones, UA: NEGATIVE
Nitrite, UA: NEGATIVE
Odor: NEGATIVE
POC,PROTEIN,UA: NEGATIVE
Spec Grav, UA: 1.02 (ref 1.010–1.025)
Urobilinogen, UA: 0.2 E.U./dL
pH, UA: 6 (ref 5.0–8.0)

## 2019-03-19 LAB — NICOTINE SCREEN, URINE: Cotinine Ql Scrn, Ur: NEGATIVE ng/mL

## 2019-03-19 NOTE — Progress Notes (Signed)
HPI:      [name] is a 23 y.o. G2P0010 who LMP was Patient's last menstrual period was 12/28/2018 (exact date).  Subjective:   She presents today with complaint of 2 episodes of spotting in the last week.  Her last pregnancy ended in miscarriage and she is very concerned this could be another miscarriage.  She has had intercourse during the first trimester but she states it is not related to the bleeding-several days apart.  She denies cramping.    Hx: The following portions of the patient's history were reviewed and updated as appropriate:             She  has a past medical history of Ovarian cyst and PTSD (post-traumatic stress disorder). She does not have any pertinent problems on file. She  has a past surgical history that includes no history. Her family history includes Cervical cancer in her mother; Gallstones in her paternal grandmother; Hypertension in her father. She  reports that she has never smoked. She has never used smokeless tobacco. She reports previous alcohol use. She reports previous drug use. She has a current medication list which includes the following prescription(s): acetaminophen, bonjesta, and prenatal multivitamin. She has No Known Allergies.       Review of Systems:  Review of Systems  Constitutional: Denied constitutional symptoms, night sweats, recent illness, fatigue, fever, insomnia and weight loss.  Eyes: Denied eye symptoms, eye pain, photophobia, vision change and visual disturbance.  Ears/Nose/Throat/Neck: Denied ear, nose, throat or neck symptoms, hearing loss, nasal discharge, sinus congestion and sore throat.  Cardiovascular: Denied cardiovascular symptoms, arrhythmia, chest pain/pressure, edema, exercise intolerance, orthopnea and palpitations.  Respiratory: Denied pulmonary symptoms, asthma, pleuritic pain, productive sputum, cough, dyspnea and wheezing.  Gastrointestinal: Denied, gastro-esophageal reflux, melena, nausea and  vomiting.  Genitourinary: See HPI for additional information.  Musculoskeletal: Denied musculoskeletal symptoms, stiffness, swelling, muscle weakness and myalgia.  Dermatologic: Denied dermatology symptoms, rash and scar.  Neurologic: Denied neurology symptoms, dizziness, headache, neck pain and syncope.  Psychiatric: Denied psychiatric symptoms, anxiety and depression.  Endocrine: Denied endocrine symptoms including hot flashes and night sweats.   Meds:   Current Outpatient Medications on File Prior to Visit  Medication Sig Dispense Refill  . acetaminophen (TYLENOL) 325 MG tablet Take 650 mg by mouth every 6 (six) hours as needed.    . Doxylamine-Pyridoxine ER (BONJESTA) 20-20 MG TBCR Take 1 capsule by mouth 2 (two) times daily. 62 tablet 4  . Prenatal Vit-Fe Fumarate-FA (PRENATAL MULTIVITAMIN) TABS tablet Take 1 tablet by mouth daily at 12 noon.     No current facility-administered medications on file prior to visit.     Objective:     Vitals:   03/19/19 1453  BP: 125/78  Pulse: 94                Assessment:    G2P0010 Patient Active Problem List   Diagnosis Date Noted  . History of miscarriage, currently pregnant 02/12/2019     1. Bleeding in early pregnancy   2. Threatened abortion        Plan:            1.  Ultrasound to confirm fetal viability.  2.  Type and Rh pending.  SAb I have discussed the possibility of miscarriage with the patient.  I have informed her that vaginal bleeding, cramping or passage of tissue are the most common signs of miscarriage.  Should she have heavy bleeding, pass tissue or have  other problems, she has been informed to call the office immediately.  I have advised her to remain at pelvic rest for at least one week after her last episode of bleeding.  If she is currently working, I have instructed her to discontinue until this situation resolves.  Complete versus incomplete miscarriage was discussed and the patient is aware that  should she develop heavy bleeding, fever, or persistent crampy pelvic pain she may require a D & E to remove the remaining portion of the miscarried pregnancy.  I advised her to keep me informed should her condition change.  Orders Orders Placed This Encounter  Procedures  . US OB Comp Less 14 Wks  . POC Urinalysis Dipstick OB    No orders of the defined types were placed in this encounter.     F/U  No follow-ups on file. I spent 31 minutes involved in the care of this patient of which greater than 50% was spent discussing for trimester bleeding, miscarriage, viable pregnancy, work-up and possible treatments and the section below labeled as addendum.  All questions answered.  Finis Bud, M.D. 03/19/2019 3:24 PM  Addendum:  Patient had her ultrasound and returned to discuss the findings.  Findings at ultrasound revealed a crown-rump length of 7 weeks with no fetal heart tones present.  (Missed AB)  I have discussed these findings with her in detail.  I have given her the options of expectant management, medical miscarriage at home, and D&E.  The risks and benefits of each were discussed in detail.  All of her questions were answered.  She would like time to discuss this with her husband and make a final decision.

## 2019-03-19 NOTE — Progress Notes (Signed)
Early ob- spotting and one episode of bright red x 4 days. NO cramps. NO ic recently. BM- wnl. HX of miscarriage. 11/2018-

## 2019-03-20 ENCOUNTER — Ambulatory Visit (INDEPENDENT_AMBULATORY_CARE_PROVIDER_SITE_OTHER): Payer: BC Managed Care – PPO | Admitting: Obstetrics and Gynecology

## 2019-03-20 ENCOUNTER — Other Ambulatory Visit
Admission: RE | Admit: 2019-03-20 | Discharge: 2019-03-20 | Disposition: A | Discharge status: Home / Self Care | Payer: BC Managed Care – PPO | Source: Ambulatory Visit | Attending: Obstetrics and Gynecology | Admitting: Obstetrics and Gynecology

## 2019-03-20 ENCOUNTER — Encounter: Payer: Self-pay | Admitting: Obstetrics and Gynecology

## 2019-03-20 VITALS — BP 106/73 | HR 87 | Ht 64.0 in | Wt 205.4 lb

## 2019-03-20 DIAGNOSIS — Z01818 Encounter for other preprocedural examination: Secondary | ICD-10-CM

## 2019-03-20 DIAGNOSIS — O021 Missed abortion: Secondary | ICD-10-CM | POA: Insufficient documentation

## 2019-03-20 DIAGNOSIS — Z3A01 Less than 8 weeks gestation of pregnancy: Secondary | ICD-10-CM

## 2019-03-20 DIAGNOSIS — Z20828 Contact with and (suspected) exposure to other viral communicable diseases: Secondary | ICD-10-CM | POA: Diagnosis not present

## 2019-03-20 DIAGNOSIS — Z01812 Encounter for preprocedural laboratory examination: Secondary | ICD-10-CM | POA: Insufficient documentation

## 2019-03-20 LAB — RPR: RPR Ser Ql: NONREACTIVE

## 2019-03-20 LAB — VARICELLA ZOSTER ANTIBODY, IGG: Varicella zoster IgG: <135 index — ABNORMAL LOW (ref >165)

## 2019-03-20 LAB — RUBELLA SCREEN: Rubella Antibodies, IGG: 8.47 index (ref >0.99)

## 2019-03-20 LAB — URINALYSIS, ROUTINE W REFLEX MICROSCOPIC
Bilirubin, UA: NEGATIVE
Glucose, UA: NEGATIVE
Ketones, UA: NEGATIVE
Leukocytes,UA: NEGATIVE
Nitrite, UA: NEGATIVE
Protein,UA: NEGATIVE
RBC, UA: NEGATIVE
Specific Gravity, UA: 1.009 (ref 1.005–1.030)
Urobilinogen, Ur: 0.2 mg/dL (ref 0.2–1.0)
pH, UA: 6 (ref 5.0–7.5)

## 2019-03-20 LAB — TOXOPLASMA ANTIBODIES- IGG AND  IGM
Toxoplasma Antibody- IgM: <3 AU/mL (ref 0.0–7.9)
Toxoplasma IgG Ratio: <3 IU/mL (ref 0.0–7.1)

## 2019-03-20 LAB — HEMOGLOBIN A1C
Est. average glucose Bld gHb Est-mCnc: 100 mg/dL
Hgb A1c MFr Bld: 5.1 % (ref 4.8–5.6)

## 2019-03-20 LAB — CULTURE, OB URINE

## 2019-03-20 LAB — HEPATITIS B SURFACE ANTIGEN: Hepatitis B Surface Ag: NEGATIVE

## 2019-03-20 LAB — TSH: TSH: 2.36 u[IU]/mL (ref 0.450–4.500)

## 2019-03-20 LAB — ABO AND RH: Rh Factor: NEGATIVE

## 2019-03-20 LAB — HIV ANTIBODY (ROUTINE TESTING W REFLEX): HIV Screen 4th Generation wRfx: NONREACTIVE

## 2019-03-20 LAB — ANTIBODY SCREEN: Antibody Screen: NEGATIVE

## 2019-03-20 LAB — URINE CULTURE, OB REFLEX

## 2019-03-20 MED ORDER — RHO D IMMUNE GLOBULIN 1500 UNITS IM SOSY
1500.0000 [IU] | PREFILLED_SYRINGE | Freq: Once | INTRAMUSCULAR | Status: AC
  Administered 2019-03-20: 1500 [IU] via INTRAMUSCULAR

## 2019-03-20 NOTE — H&P (View-Only) (Signed)
      PRE-OPERATIVE HISTORY AND PHYSICAL EXAM  PCP:  [facility] Subjective:   HPI:  Sarah Nichols is a 23 y.o. G2P0010.  Patient's last menstrual period was 12/28/2018 (exact date).  She presents today for a pre-op discussion and PE.  She has the following symptoms: She has decided that she would like a D&E for management of missed AB.  Review of Systems:   Constitutional: Denied constitutional symptoms, night sweats, recent illness, fatigue, fever, insomnia and weight loss.  Eyes: Denied eye symptoms, eye pain, photophobia, vision change and visual disturbance.  Ears/Nose/Throat/Neck: Denied ear, nose, throat or neck symptoms, hearing loss, nasal discharge, sinus congestion and sore throat.  Cardiovascular: Denied cardiovascular symptoms, arrhythmia, chest pain/pressure, edema, exercise intolerance, orthopnea and palpitations.  Respiratory: Denied pulmonary symptoms, asthma, pleuritic pain, productive sputum, cough, dyspnea and wheezing.  Gastrointestinal: Denied, gastro-esophageal reflux, melena, nausea and vomiting.  Genitourinary:  Small amount of vaginal bleeding  Musculoskeletal: Denied musculoskeletal symptoms, stiffness, swelling, muscle weakness and myalgia.  Dermatologic: Denied dermatology symptoms, rash and scar.  Neurologic: Denied neurology symptoms, dizziness, headache, neck pain and syncope.  Psychiatric: Denied psychiatric symptoms, anxiety and depression.  Endocrine: Denied endocrine symptoms including hot flashes and night sweats.   OB History  Gravida Para Term Preterm AB Living  2       1    SAB TAB Ectopic Multiple Live Births  1            # Outcome Date GA Lbr Len/2nd Weight Sex Delivery Anes PTL Lv  2 Current           1 SAB 11/2018            Past Medical History:  Diagnosis Date  . Ovarian cyst   . PTSD (post-traumatic stress disorder)     Past Surgical History:  Procedure Laterality Date  . no history        SOCIAL  HISTORY: Social History   Tobacco Use  Smoking Status Never Smoker  Smokeless Tobacco Never Used   Social History   Substance and Sexual Activity  Alcohol Use Not Currently   Social History   Substance and Sexual Activity  Drug Use Not Currently    Family History  Problem Relation Age of Onset  . Cervical cancer Mother   . Hypertension Father   . Gallstones Paternal Grandmother     ALLERGIES:  Patient has no known allergies.  MEDS:   Current Outpatient Medications on File Prior to Visit  Medication Sig Dispense Refill  . Doxylamine-Pyridoxine ER (BONJESTA) 20-20 MG TBCR Take 1 capsule by mouth 2 (two) times daily. (Patient not taking: Reported on 03/20/2019) 62 tablet 4   No current facility-administered medications on file prior to visit.     Meds ordered this encounter  Medications  . Rho D Immune Globulin SOSY 1,500 Units     Physical examination BP 106/73   Pulse 87   Ht 5' 4" (1.626 m)   Wt 205 lb 7 oz (93.2 kg)   LMP 12/28/2018 (Exact Date)   BMI 35.26 kg/m   General NAD, Conversant  HEENT Atraumatic; Op clear with mmm.  Normo-cephalic. Pupils reactive. Anicteric sclerae  Thyroid/Neck Smooth without nodularity or enlargement. Normal ROM.  Neck Supple.  Skin No rashes, lesions or ulceration. Normal palpated skin turgor. No nodularity.  Breasts: No masses or discharge.  Symmetric.  No axillary adenopathy.  Lungs: Clear to auscultation.No rales or wheezes. Normal Respiratory effort, no retractions.    Heart: NSR.  No murmurs or rubs appreciated. No periferal edema  Abdomen: Soft.  Non-tender.  No masses.  No HSM. No hernia  Extremities: Moves all appropriately.  Normal ROM for age. No lymphadenopathy.  Neuro: Oriented to PPT.  Normal mood. Normal affect.     Pelvic:  Deferred to the OR at the time of surgery.    Ultrasound results reveal fetal pole without fetal heart tones approximately [redacted] weeks gestation.   Assessment:   G2P0010 Patient Active  Problem List   Diagnosis Date Noted  . History of miscarriage, currently pregnant 02/12/2019    1. Preop examination   2. Missed ab      Plan:   Orders: Meds ordered this encounter  Medications  . Rho D Immune Globulin SOSY 1,500 Units     1.  D&E 2.  RhoGam given today  Pre-op discussions regarding Risks and Benefits of her scheduled surgery.  D&E The procedure and the risks and benefits of dilation and curettage/evacuation have been explained to the patient.  The specific risks of bleeding, infection, anesthesia, uterine perforation, and damage to bowel or bladder  have been specifically discussed.  I have answered all of her questions and I believe that she has an adequate and informed understanding of this procedure.

## 2019-03-20 NOTE — Progress Notes (Signed)
PRE-OPERATIVE HISTORY AND PHYSICAL EXAM  PCP:  [facility] Subjective:   HPI:  Sarah Nichols is a 23 y.o. G2P0010.  Patient's last menstrual period was 12/28/2018 (exact date).  She presents today for a pre-op discussion and PE.  She has the following symptoms: She has decided that she would like a D&E for management of missed AB.  Review of Systems:   Constitutional: Denied constitutional symptoms, night sweats, recent illness, fatigue, fever, insomnia and weight loss.  Eyes: Denied eye symptoms, eye pain, photophobia, vision change and visual disturbance.  Ears/Nose/Throat/Neck: Denied ear, nose, throat or neck symptoms, hearing loss, nasal discharge, sinus congestion and sore throat.  Cardiovascular: Denied cardiovascular symptoms, arrhythmia, chest pain/pressure, edema, exercise intolerance, orthopnea and palpitations.  Respiratory: Denied pulmonary symptoms, asthma, pleuritic pain, productive sputum, cough, dyspnea and wheezing.  Gastrointestinal: Denied, gastro-esophageal reflux, melena, nausea and vomiting.  Genitourinary:  Small amount of vaginal bleeding  Musculoskeletal: Denied musculoskeletal symptoms, stiffness, swelling, muscle weakness and myalgia.  Dermatologic: Denied dermatology symptoms, rash and scar.  Neurologic: Denied neurology symptoms, dizziness, headache, neck pain and syncope.  Psychiatric: Denied psychiatric symptoms, anxiety and depression.  Endocrine: Denied endocrine symptoms including hot flashes and night sweats.   OB History  Gravida Para Term Preterm AB Living  2       1    SAB TAB Ectopic Multiple Live Births  1            # Outcome Date GA Lbr Len/2nd Weight Sex Delivery Anes PTL Lv  2 Current           1 SAB 11/2018            Past Medical History:  Diagnosis Date  . Ovarian cyst   . PTSD (post-traumatic stress disorder)     Past Surgical History:  Procedure Laterality Date  . no history        SOCIAL  HISTORY: Social History   Tobacco Use  Smoking Status Never Smoker  Smokeless Tobacco Never Used   Social History   Substance and Sexual Activity  Alcohol Use Not Currently   Social History   Substance and Sexual Activity  Drug Use Not Currently    Family History  Problem Relation Age of Onset  . Cervical cancer Mother   . Hypertension Father   . Gallstones Paternal Grandmother     ALLERGIES:  Patient has no known allergies.  MEDS:   Current Outpatient Medications on File Prior to Visit  Medication Sig Dispense Refill  . Doxylamine-Pyridoxine ER (BONJESTA) 20-20 MG TBCR Take 1 capsule by mouth 2 (two) times daily. (Patient not taking: Reported on 03/20/2019) 62 tablet 4   No current facility-administered medications on file prior to visit.     Meds ordered this encounter  Medications  . Rho D Immune Globulin SOSY 1,500 Units     Physical examination BP 106/73   Pulse 87   Ht 5\' 4"  (1.626 m)   Wt 205 lb 7 oz (93.2 kg)   LMP 12/28/2018 (Exact Date)   BMI 35.26 kg/m   General NAD, Conversant  HEENT Atraumatic; Op clear with mmm.  Normo-cephalic. Pupils reactive. Anicteric sclerae  Thyroid/Neck Smooth without nodularity or enlargement. Normal ROM.  Neck Supple.  Skin No rashes, lesions or ulceration. Normal palpated skin turgor. No nodularity.  Breasts: No masses or discharge.  Symmetric.  No axillary adenopathy.  Lungs: Clear to auscultation.No rales or wheezes. Normal Respiratory effort, no retractions.  Heart:  NSR.  No murmurs or rubs appreciated. No periferal edema  Abdomen: Soft.  Non-tender.  No masses.  No HSM. No hernia  Extremities: Moves all appropriately.  Normal ROM for age. No lymphadenopathy.  Neuro: Oriented to PPT.  Normal mood. Normal affect.     Pelvic:  Deferred to the OR at the time of surgery.    Ultrasound results reveal fetal pole without fetal heart tones approximately [redacted] weeks gestation.   Assessment:   G2P0010 Patient Active  Problem List   Diagnosis Date Noted  . History of miscarriage, currently pregnant 02/12/2019    1. Preop examination   2. Missed ab      Plan:   Orders: Meds ordered this encounter  Medications  . Rho D Immune Globulin SOSY 1,500 Units     1.  D&E 2.  RhoGam given today  Pre-op discussions regarding Risks and Benefits of her scheduled surgery.  D&E The procedure and the risks and benefits of dilation and curettage/evacuation have been explained to the patient.  The specific risks of bleeding, infection, anesthesia, uterine perforation, and damage to bowel or bladder  have been specifically discussed.  I have answered all of her questions and I believe that she has an adequate and informed understanding of this procedure.  I spent 32 minutes involved in the care of this patient of which greater than 50% was spent discussing D&E, causes of miscarriage, future pregnancies, risks and benefits of surgery, other options including misoprostol, necessity of RhoGam.  All questions answered  Elonda Huskyavid J. Evans, M.D. 03/20/2019 4:09 PM

## 2019-03-20 NOTE — H&P (Signed)
PRE-OPERATIVE HISTORY AND PHYSICAL EXAM  [facility]:  [facility] Subjective:   HPI:  Sarah Nichols is a 23 y.o. G2P0010.  Patient's last menstrual period was 12/28/2018 (exact date).  She presents today for a pre-op discussion and PE.  She has the following symptoms: She has decided that she would like a D&E for management of missed AB.  Review of Systems:   Constitutional: Denied constitutional symptoms, night sweats, recent illness, fatigue, fever, insomnia and weight loss.  Eyes: Denied eye symptoms, eye pain, photophobia, vision change and visual disturbance.  Ears/Nose/Throat/Neck: Denied ear, nose, throat or neck symptoms, hearing loss, nasal discharge, sinus congestion and sore throat.  Cardiovascular: Denied cardiovascular symptoms, arrhythmia, chest pain/pressure, edema, exercise intolerance, orthopnea and palpitations.  Respiratory: Denied pulmonary symptoms, asthma, pleuritic pain, productive sputum, cough, dyspnea and wheezing.  Gastrointestinal: Denied, gastro-esophageal reflux, melena, nausea and vomiting.  Genitourinary:  Small amount of vaginal bleeding  Musculoskeletal: Denied musculoskeletal symptoms, stiffness, swelling, muscle weakness and myalgia.  Dermatologic: Denied dermatology symptoms, rash and scar.  Neurologic: Denied neurology symptoms, dizziness, headache, neck pain and syncope.  Psychiatric: Denied psychiatric symptoms, anxiety and depression.  Endocrine: Denied endocrine symptoms including hot flashes and night sweats.   OB History  Gravida Para Term Preterm AB Living  2       1    SAB TAB Ectopic Multiple Live Births  1            # Outcome Date GA Lbr Len/2nd Weight Sex Delivery Anes PTL Lv  2 Current           1 SAB 11/2018            Past Medical History:  Diagnosis Date  . Ovarian cyst   . PTSD (post-traumatic stress disorder)     Past Surgical History:  Procedure Laterality Date  . no history        SOCIAL  HISTORY: Social History   Tobacco Use  Smoking Status Never Smoker  Smokeless Tobacco Never Used   Social History   Substance and Sexual Activity  Alcohol Use Not Currently   Social History   Substance and Sexual Activity  Drug Use Not Currently    Family History  Problem Relation Age of Onset  . Cervical cancer Mother   . Hypertension Father   . Gallstones Paternal Grandmother     ALLERGIES:  Patient has no known allergies.  MEDS:   Current Outpatient Medications on File Prior to Visit  Medication Sig Dispense Refill  . Doxylamine-Pyridoxine ER (BONJESTA) 20-20 MG TBCR Take 1 capsule by mouth 2 (two) times daily. (Patient not taking: Reported on 03/20/2019) 62 tablet 4   No current facility-administered medications on file prior to visit.     Meds ordered this encounter  Medications  . Rho D Immune Globulin SOSY 1,500 Units     Physical examination BP 106/73   Pulse 87   Ht 5\' 4"  (1.626 m)   Wt 205 lb 7 oz (93.2 kg)   LMP 12/28/2018 (Exact Date)   BMI 35.26 kg/m   General NAD, Conversant  HEENT Atraumatic; Op clear with mmm.  Normo-cephalic. Pupils reactive. Anicteric sclerae  Thyroid/Neck Smooth without nodularity or enlargement. Normal ROM.  Neck Supple.  Skin No rashes, lesions or ulceration. Normal palpated skin turgor. No nodularity.  Breasts: No masses or discharge.  Symmetric.  No axillary adenopathy.  Lungs: Clear to auscultation.No rales or wheezes. Normal Respiratory effort, no retractions.  Heart: NSR.  No murmurs or rubs appreciated. No periferal edema  Abdomen: Soft.  Non-tender.  No masses.  No HSM. No hernia  Extremities: Moves all appropriately.  Normal ROM for age. No lymphadenopathy.  Neuro: Oriented to PPT.  Normal mood. Normal affect.     Pelvic:  Deferred to the OR at the time of surgery.    Ultrasound results reveal fetal pole without fetal heart tones approximately [redacted] weeks gestation.   Assessment:   G2P0010 Patient Active  Problem List   Diagnosis Date Noted  . History of miscarriage, currently pregnant 02/12/2019    1. Preop examination   2. Missed ab      Plan:   Orders: Meds ordered this encounter  Medications  . Rho D Immune Globulin SOSY 1,500 Units     1.  D&E 2.  RhoGam given today  Pre-op discussions regarding Risks and Benefits of her scheduled surgery.  D&E The procedure and the risks and benefits of dilation and curettage/evacuation have been explained to the patient.  The specific risks of bleeding, infection, anesthesia, uterine perforation, and damage to bowel or bladder  have been specifically discussed.  I have answered all of her questions and I believe that she has an adequate and informed understanding of this procedure.

## 2019-03-21 ENCOUNTER — Encounter
Admission: RE | Disposition: A | Discharge status: Home / Self Care | Payer: Self-pay | Source: Home / Self Care | Attending: Obstetrics and Gynecology

## 2019-03-21 ENCOUNTER — Encounter: Payer: Self-pay | Admitting: *Deleted

## 2019-03-21 ENCOUNTER — Ambulatory Visit: Payer: BC Managed Care – PPO | Admitting: Anesthesiology

## 2019-03-21 ENCOUNTER — Other Ambulatory Visit: Payer: Self-pay

## 2019-03-21 ENCOUNTER — Ambulatory Visit
Admission: RE | Admit: 2019-03-21 | Discharge: 2019-03-21 | Disposition: A | Discharge status: Home / Self Care | Payer: BC Managed Care – PPO | Attending: Obstetrics and Gynecology | Admitting: Obstetrics and Gynecology

## 2019-03-21 DIAGNOSIS — O021 Missed abortion: Secondary | ICD-10-CM | POA: Diagnosis not present

## 2019-03-21 DIAGNOSIS — F419 Anxiety disorder, unspecified: Secondary | ICD-10-CM | POA: Diagnosis not present

## 2019-03-21 DIAGNOSIS — K219 Gastro-esophageal reflux disease without esophagitis: Secondary | ICD-10-CM | POA: Insufficient documentation

## 2019-03-21 DIAGNOSIS — Z3A01 Less than 8 weeks gestation of pregnancy: Secondary | ICD-10-CM | POA: Diagnosis not present

## 2019-03-21 HISTORY — PX: DILATION AND EVACUATION: SHX1459

## 2019-03-21 LAB — URINE DRUG SCREEN, QUALITATIVE (ARMC ONLY)
Amphetamines, Ur Screen: NOT DETECTED
Barbiturates, Ur Screen: NOT DETECTED
Benzodiazepine, Ur Scrn: NOT DETECTED
Cannabinoid 50 Ng, Ur ~~LOC~~: NOT DETECTED
Cocaine Metabolite,Ur ~~LOC~~: NOT DETECTED
MDMA (Ecstasy)Ur Screen: NOT DETECTED
Methadone Scn, Ur: NOT DETECTED
Opiate, Ur Screen: NOT DETECTED
Phencyclidine (PCP) Ur S: NOT DETECTED
Tricyclic, Ur Screen: NOT DETECTED

## 2019-03-21 LAB — CBC
HCT: 44.8 % (ref 36.0–46.0)
Hemoglobin: 14.7 g/dL (ref 12.0–15.0)
MCH: 27.3 pg (ref 26.0–34.0)
MCHC: 32.8 g/dL (ref 30.0–36.0)
MCV: 83.3 fL (ref 80.0–100.0)
Platelets: 307 10*3/uL (ref 150–400)
RBC: 5.38 MIL/uL — ABNORMAL HIGH (ref 3.87–5.11)
RDW: 12.6 % (ref 11.5–15.5)
WBC: 8.3 10*3/uL (ref 4.0–10.5)
nRBC: 0 % (ref 0.0–0.2)

## 2019-03-21 LAB — GC/CHLAMYDIA PROBE AMP
Chlamydia trachomatis, NAA: NEGATIVE
Neisseria Gonorrhoeae by PCR: NEGATIVE

## 2019-03-21 LAB — SARS CORONAVIRUS 2 (TAT 6-24 HRS): SARS Coronavirus 2: NEGATIVE

## 2019-03-21 SURGERY — DILATION AND EVACUATION, UTERUS
Anesthesia: General | Site: Cervix

## 2019-03-21 MED ORDER — FENTANYL CITRATE (PF) 100 MCG/2ML IJ SOLN
INTRAMUSCULAR | Status: DC | PRN
  Administered 2019-03-21 (×2): 50 ug via INTRAVENOUS

## 2019-03-21 MED ORDER — PROPOFOL 10 MG/ML IV BOLUS
INTRAVENOUS | Status: DC | PRN
  Administered 2019-03-21: 180 mg via INTRAVENOUS

## 2019-03-21 MED ORDER — LACTATED RINGERS IV SOLN
INTRAVENOUS | Status: DC
  Administered 2019-03-21: 07:00:00 via INTRAVENOUS

## 2019-03-21 MED ORDER — PROPOFOL 10 MG/ML IV BOLUS
INTRAVENOUS | Status: AC
  Filled 2019-03-21: qty 20

## 2019-03-21 MED ORDER — FENTANYL CITRATE (PF) 100 MCG/2ML IJ SOLN
INTRAMUSCULAR | Status: AC
  Filled 2019-03-21: qty 2

## 2019-03-21 MED ORDER — ONDANSETRON 4 MG PO TBDP: 4.0000 mg | ORAL_TABLET | Freq: Four times a day (QID) | ORAL | Status: DC | PRN

## 2019-03-21 MED ORDER — SEVOFLURANE IN SOLN
RESPIRATORY_TRACT | Status: AC
  Filled 2019-03-21: qty 250

## 2019-03-21 MED ORDER — OXYTOCIN 20 UNITS IN LACTATED RINGERS INFUSION - SIMPLE
INTRAVENOUS | Status: AC | PRN
  Administered 2019-03-21: 500 mL/h via INTRAVENOUS

## 2019-03-21 MED ORDER — MIDAZOLAM HCL 2 MG/2ML IJ SOLN
INTRAMUSCULAR | Status: DC | PRN
  Administered 2019-03-21: 2 mg via INTRAVENOUS

## 2019-03-21 MED ORDER — FENTANYL CITRATE (PF) 100 MCG/2ML IJ SOLN
25.0000 ug | INTRAMUSCULAR | Status: DC | PRN
  Administered 2019-03-21: 09:00:00 25 ug via INTRAVENOUS

## 2019-03-21 MED ORDER — OXYTOCIN 10 UNIT/ML IJ SOLN
INTRAMUSCULAR | Status: AC
  Filled 2019-03-21: qty 1

## 2019-03-21 MED ORDER — LIDOCAINE HCL (PF) 2 % IJ SOLN
INTRAMUSCULAR | Status: AC
  Filled 2019-03-21: qty 10

## 2019-03-21 MED ORDER — SODIUM CHLORIDE 0.9 % IV SOLN
INTRAVENOUS | Status: DC | PRN
  Administered 2019-03-21 (×2): 10 [IU] via INTRAVENOUS

## 2019-03-21 MED ORDER — ONDANSETRON HCL 4 MG/2ML IJ SOLN
INTRAMUSCULAR | Status: DC | PRN
  Administered 2019-03-21: 4 mg via INTRAVENOUS

## 2019-03-21 MED ORDER — DEXTROSE IN LACTATED RINGERS 5 % IV SOLN: INTRAVENOUS | Status: DC

## 2019-03-21 MED ORDER — ONDANSETRON HCL 4 MG/2ML IJ SOLN: 4.0000 mg | Freq: Once | INTRAMUSCULAR | Status: DC | PRN

## 2019-03-21 MED ORDER — DEXAMETHASONE SODIUM PHOSPHATE 10 MG/ML IJ SOLN
INTRAMUSCULAR | Status: DC | PRN
  Administered 2019-03-21: 10 mg via INTRAVENOUS

## 2019-03-21 MED ORDER — KETOROLAC TROMETHAMINE 30 MG/ML IJ SOLN
INTRAMUSCULAR | Status: AC
  Filled 2019-03-21: qty 1

## 2019-03-21 MED ORDER — MIDAZOLAM HCL 2 MG/2ML IJ SOLN
INTRAMUSCULAR | Status: AC
  Filled 2019-03-21: qty 2

## 2019-03-21 MED ORDER — OXYCODONE-ACETAMINOPHEN 5-325 MG PO TABS: 1.0000 | ORAL_TABLET | ORAL | Status: DC | PRN

## 2019-03-21 MED ORDER — ONDANSETRON HCL 4 MG/2ML IJ SOLN
INTRAMUSCULAR | Status: AC
  Filled 2019-03-21: qty 2

## 2019-03-21 MED ORDER — SUCCINYLCHOLINE CHLORIDE 20 MG/ML IJ SOLN
INTRAMUSCULAR | Status: AC
  Filled 2019-03-21: qty 1

## 2019-03-21 MED ORDER — DEXAMETHASONE SODIUM PHOSPHATE 10 MG/ML IJ SOLN
INTRAMUSCULAR | Status: AC
  Filled 2019-03-21: qty 1

## 2019-03-21 MED ORDER — ONDANSETRON HCL 4 MG/2ML IJ SOLN: 4.0000 mg | Freq: Four times a day (QID) | INTRAMUSCULAR | Status: DC | PRN

## 2019-03-21 SURGICAL SUPPLY — 26 items
ADAPTER VACURETTE TBG SET 14 (CANNULA) IMPLANT
CATH ROBINSON RED A/P 16FR (CATHETERS) ×2 IMPLANT
COVER WAND RF STERILE (DRAPES) IMPLANT
CUP MEDICINE 2OZ PLAST GRAD ST (MISCELLANEOUS) ×2 IMPLANT
DRSG TELFA 3X8 NADH (GAUZE/BANDAGES/DRESSINGS) ×2 IMPLANT
FILTER UTR ASPR SPEC (MISCELLANEOUS) ×1 IMPLANT
FLTR UTR ASPR SPEC (MISCELLANEOUS) ×2
GAUZE 4X4 16PLY RFD (DISPOSABLE) ×2 IMPLANT
GLOVE BIOGEL PI ORTHO PRO 7.5 (GLOVE) ×1
GLOVE PI ORTHO PRO STRL 7.5 (GLOVE) ×1 IMPLANT
GOWN STRL REUS W/ TWL LRG LVL3 (GOWN DISPOSABLE) ×2 IMPLANT
GOWN STRL REUS W/TWL LRG LVL3 (GOWN DISPOSABLE) ×2
KIT BERKELEY 1ST TRIMESTER 3/8 (MISCELLANEOUS) ×2 IMPLANT
KIT TURNOVER KIT A (KITS) ×2 IMPLANT
NEEDLE HYPO 25X1 1.5 SAFETY (NEEDLE) ×2 IMPLANT
PACK DNC HYST (MISCELLANEOUS) ×2 IMPLANT
PAD OB MATERNITY 4.3X12.25 (PERSONAL CARE ITEMS) ×2 IMPLANT
PAD PREP 24X41 OB/GYN DISP (PERSONAL CARE ITEMS) ×2 IMPLANT
SET BERKELEY SUCTION TUBING (SUCTIONS) ×2 IMPLANT
SOL PREP PVP 2OZ (MISCELLANEOUS) ×2
SOLUTION PREP PVP 2OZ (MISCELLANEOUS) ×1 IMPLANT
VACURETTE 10 RIGID CVD (CANNULA) IMPLANT
VACURETTE 12 RIGID CVD (CANNULA) IMPLANT
VACURETTE 7MM F TIP (CANNULA)
VACURETTE 7MM F TIP STRL (CANNULA) IMPLANT
VACURETTE 8 RIGID CVD (CANNULA) ×2 IMPLANT

## 2019-03-21 NOTE — Discharge Instructions (Signed)
Dilation and Curettage or Vacuum Curettage, Care After °These instructions give you information about caring for yourself after your procedure. Your doctor may also give you more specific instructions. Call your doctor if you have any problems or questions after your procedure. °Follow these instructions at home: °Activity °· Do not drive or use heavy machinery while taking prescription pain medicine. °· For 24 hours after your procedure, avoid driving. °· Take short walks often, followed by rest periods. Ask your doctor what activities are safe for you. After one or two days, you may be able to return to your normal activities. °· Do not lift anything that is heavier than 10 lb (4.5 kg) until your doctor approves. °· For at least 2 weeks, or as long as told by your doctor: °? Do not douche. °? Do not use tampons. °? Do not have sex. °General instructions ° °· Take over-the-counter and prescription medicines only as told by your doctor. This is very important if you take blood thinning medicine. °· Do not take baths, swim, or use a hot tub until your doctor approves. Take showers instead of baths. °· Wear compression stockings as told by your doctor. °· It is up to you to get the results of your procedure. Ask your doctor when your results will be ready. °· Keep all follow-up visits as told by your doctor. This is important. °Contact a doctor if: °· You have very bad cramps that get worse or do not get better with medicine. °· You have very bad pain in your belly (abdomen). °· You cannot drink fluids without throwing up (vomiting). °· You get pain in a different part of the area between your belly and thighs (pelvis). °· You have bad-smelling discharge from your vagina. °· You have a rash. °Get help right away if: °· You are bleeding a lot from your vagina. A lot of bleeding means soaking more than one sanitary pad in an hour, for 2 hours in a row. °· You have clumps of blood (blood clots) coming from your  vagina. °· You have a fever or chills. °· Your belly feels very tender or hard. °· You have chest pain. °· You have trouble breathing. °· You cough up blood. °· You feel dizzy. °· You feel light-headed. °· You pass out (faint). °· You have pain in your neck or shoulder area. °Summary °· Take short walks often, followed by rest periods. Ask your doctor what activities are safe for you. After one or two days, you may be able to return to your normal activities. °· Do not lift anything that is heavier than 10 lb (4.5 kg) until your doctor approves. °· Do not take baths, swim, or use a hot tub until your doctor approves. Take showers instead of baths. °· Contact your doctor if you have any symptoms of infection, like bad-smelling discharge from your vagina. °This information is not intended to replace advice given to you by your health care provider. Make sure you discuss any questions you have with your health care provider. °Document Released: 04/25/2008 Document Revised: 06/29/2017 Document Reviewed: 04/03/2016 °Elsevier Patient Education © 2020 [employer] ° ° ° °AMBULATORY SURGERY  °DISCHARGE INSTRUCTIONS ° ° °1) The drugs that you were given will stay in your system until tomorrow so for the next 24 hours you should not: ° °A) Drive an automobile °B) Make any legal decisions °C) Drink any alcoholic beverage ° ° °2) You may resume regular meals tomorrow.  Today it is   better to start with liquids and gradually work up to solid foods. ° °You may eat anything you prefer, but it is better to start with liquids, then soup and crackers, and gradually work up to solid foods. ° ° °3) Please notify your doctor immediately if you have any unusual bleeding, trouble breathing, redness and pain at the surgery site, drainage, fever, or pain not relieved by medication. ° ° ° °4) Additional Instructions: ° ° ° ° ° ° ° °Please contact your physician with any problems or Same Day Surgery at [phone], Monday through Friday 6 am  to 4 pm, or [facility] number at [phone]. ° ° °

## 2019-03-21 NOTE — Anesthesia Post-op Follow-up Note (Signed)
Anesthesia QCDR form completed.        

## 2019-03-21 NOTE — Anesthesia Preprocedure Evaluation (Addendum)
Anesthesia Evaluation  Patient identified by MRN, date of birth, ID band Patient awake    Reviewed: Allergy & Precautions, NPO status , Patient's Chart, lab work & pertinent test results  History of Anesthesia Complications Negative for: history of anesthetic complications  Airway Mallampati: II       Dental   Pulmonary neg sleep apnea, neg COPD, Not current smoker,           Cardiovascular (-) hypertension(-) Past MI and (-) CHF (-) dysrhythmias (-) Valvular Problems/Murmurs     Neuro/Psych neg Seizures Anxiety    GI/Hepatic Neg liver ROS, GERD  ,  Endo/Other  neg diabetes  Renal/GU      Musculoskeletal   Abdominal   Peds  Hematology   Anesthesia Other Findings   Reproductive/Obstetrics                            Anesthesia Physical Anesthesia Plan  ASA: II  Anesthesia Plan: General   Post-op Pain Management:    Induction: Intravenous  PONV Risk Score and Plan: 3 and Dexamethasone, Ondansetron and Midazolam  Airway Management Planned: LMA  Additional Equipment:   Intra-op Plan:   Post-operative Plan:   Informed Consent: I have reviewed the patients History and Physical, chart, labs and discussed the procedure including the risks, benefits and alternatives for the proposed anesthesia with the patient or authorized representative who has indicated his/her understanding and acceptance.       Plan Discussed with:   Anesthesia Plan Comments:         Anesthesia Quick Evaluation

## 2019-03-21 NOTE — Anesthesia Procedure Notes (Signed)
Procedure Name: LMA Insertion Date/Time: 03/21/2019 7:40 AM Performed by: Jonna Clark, CRNA Pre-anesthesia Checklist: Patient identified, Patient being monitored, Timeout performed, Emergency Drugs available and Suction available Patient Re-evaluated:Patient Re-evaluated prior to induction Oxygen Delivery Method: Circle system utilized Preoxygenation: Pre-oxygenation with 100% oxygen Induction Type: IV induction Ventilation: Mask ventilation without difficulty LMA: LMA inserted LMA Size: 4.0 Tube type: Oral Number of attempts: 1 Placement Confirmation: positive ETCO2 and breath sounds checked- equal and bilateral Tube secured with: Tape Dental Injury: Teeth and Oropharynx as per pre-operative assessment

## 2019-03-21 NOTE — Op Note (Signed)
    OPERATIVE NOTE 03/21/2019 8:23 AM  PRE-OPERATIVE DIAGNOSIS:  1) MISSED AB  POST-OPERATIVE DIAGNOSIS:  Same  OPERATION:  D&E  SURGEON(S): Surgeon(s) and Role:    Harlin Heys, MD - Primary   ANESTHESIA: General  ESTIMATED BLOOD LOSS: 59ml OPERATIVE FINDINGS:   SPECIMEN:  ID Type Source Tests Collected by Time Destination  1 : Products of conception Products of Conception [facility] POC SURGICAL PATHOLOGY Harlin Heys, MD 03/10/314 [id number]     COMPLICATIONS: None  DRAINS: Foley to gravity  DISPOSITION: Stable to recovery room  DESCRIPTION OF PROCEDURE:      The patient was prepped and draped in the dorsal lithotomy position and placed under general anesthesia. Her cervix was grasped with a Jacob's tenaculum. Respecting the position and curvature of her cervix, it was dilated to accommodate a number 8 suction curette. The suction curette was placed within the endometrial cavity and a pressure greater than 65 mmHg was allowed to build. A systematic curettage was performed in all quadrants until no additional tissue was noted. The uterus became firm and globular. Pitocin was run in the IV. The tenaculum was removed from the cervix and hemostasis was noted. The weighted speculum was removed and the patient went to recovery room in stable condition.    Finis Bud, M.D. 03/21/2019 8:23 AM

## 2019-03-21 NOTE — Interval H&P Note (Signed)
History and Physical Interval Note:  03/21/2019 7:30 AM  Sarah Nichols  has presented today for surgery, with the diagnosis of MISSED AB.  The various methods of treatment have been discussed with the patient and family. After consideration of risks, benefits and other options for treatment, the patient has consented to  Procedure(s): DILATATION AND EVACUATION (N/A) as a surgical intervention.  The patient's history has been reviewed, patient examined, no change in status, stable for surgery.  I have reviewed the patient's chart and labs.  Questions were answered to the patient's satisfaction.     Jeannie Fend

## 2019-03-21 NOTE — Anesthesia Postprocedure Evaluation (Signed)
Anesthesia Post Note  Patient: Sarah Nichols  Procedure(s) Performed: DILATATION AND EVACUATION (N/A Cervix)  Patient location during evaluation: PACU Anesthesia Type: General Level of consciousness: awake and alert Pain management: pain level controlled Vital Signs Assessment: post-procedure vital signs reviewed and stable Respiratory status: spontaneous breathing and respiratory function stable Cardiovascular status: stable Anesthetic complications: no     Last Vitals:  Vitals:   03/21/19 0626 03/21/19 0822  BP: 134/80 133/84  Pulse: 82 85  Resp: 16 14  Temp: (!) 36.4 C (!) 36.2 C  SpO2: 99% 100%    Last Pain:  Vitals:   03/21/19 0626  TempSrc: Tympanic  PainSc: 0-No pain                 Yolando Gillum K

## 2019-03-21 NOTE — Transfer of Care (Signed)
Immediate Anesthesia Transfer of Care Note  Patient: Sarah Nichols  Procedure(s) Performed: DILATATION AND EVACUATION (N/A Cervix)  Patient Location: PACU  Anesthesia Type:General  Level of Consciousness: drowsy and patient cooperative  Airway & Oxygen Therapy: Patient Spontanous Breathing and Patient connected to nasal cannula oxygen  Post-op Assessment: Report given to RN and Post -op Vital signs reviewed and stable  Post vital signs: Reviewed and stable  Last Vitals:  Vitals Value Taken Time  BP 133/84 03/21/19 0822  Temp 36.2 C 03/21/19 0822  Pulse 79 03/21/19 0823  Resp 12 03/21/19 0823  SpO2 100 % 03/21/19 0823  Vitals shown include unvalidated device data.  Last Pain:  Vitals:   03/21/19 0626  TempSrc: Tympanic  PainSc: 0-No pain         Complications: No apparent anesthesia complications

## 2019-03-23 ENCOUNTER — Encounter: Payer: Self-pay | Admitting: Obstetrics and Gynecology

## 2019-03-24 LAB — TYPE AND SCREEN
ABO/RH(D): O NEG
Antibody Screen: POSITIVE

## 2019-03-24 LAB — SURGICAL PATHOLOGY

## 2019-03-25 ENCOUNTER — Encounter: Payer: BC Managed Care – PPO | Admitting: Certified Nurse Midwife

## 2019-03-27 ENCOUNTER — Encounter: Payer: Self-pay | Admitting: Obstetrics and Gynecology

## 2019-04-02 ENCOUNTER — Encounter: Payer: BC Managed Care – PPO | Admitting: Obstetrics and Gynecology

## 2019-04-03 ENCOUNTER — Other Ambulatory Visit: Payer: Self-pay

## 2019-04-03 ENCOUNTER — Encounter: Payer: Self-pay | Admitting: Obstetrics and Gynecology

## 2019-04-03 ENCOUNTER — Ambulatory Visit (INDEPENDENT_AMBULATORY_CARE_PROVIDER_SITE_OTHER): Payer: BC Managed Care – PPO | Admitting: Obstetrics and Gynecology

## 2019-04-03 VITALS — BP 105/74 | HR 74 | Ht 64.0 in | Wt 204.2 lb

## 2019-04-03 DIAGNOSIS — Z9889 Other specified postprocedural states: Secondary | ICD-10-CM

## 2019-04-03 DIAGNOSIS — O021 Missed abortion: Secondary | ICD-10-CM

## 2019-04-03 NOTE — Progress Notes (Signed)
Patient comes in today for two week follow up from [facility]. No problems today. Still having some bleeding.

## 2019-04-03 NOTE — Progress Notes (Signed)
HPI:      [name] is a 23 y.o. G2P0010 who LMP was Patient's last menstrual period was 12/28/2018 (exact date).  Subjective:   She presents today she is approximately 2 weeks postop from D&E for missed AB.  She is doing well.  She has occasional spotting but no cramping or heavy bleeding. She has decided that she would like a more long-term type of birth control because she is not ready to get pregnant again.  She would like to discuss birth control methods.    Hx: The following portions of the patient's history were reviewed and updated as appropriate:             She  has a past medical history of Ovarian cyst and PTSD (post-traumatic stress disorder). She does not have any pertinent problems on file. She  has a past surgical history that includes no history and Dilation and evacuation (N/A, 03/21/2019). Her family history includes Sarah Nichols in her mother; Sarah Nichols in her paternal grandmother; Hypertension in her father. She  reports that she has never smoked. She has never used smokeless tobacco. She reports previous alcohol use. She reports previous drug use. Drug: Marijuana. She has a current medication list which includes the following prescription(s): acetaminophen and bonjesta. She has No Known Allergies.       Review of Systems:  Review of Systems  Constitutional: Denied constitutional symptoms, night sweats, recent illness, fatigue, fever, insomnia and weight loss.  Eyes: Denied eye symptoms, eye pain, photophobia, vision change and visual disturbance.  Ears/Nose/Throat/Neck: Denied ear, nose, throat or neck symptoms, hearing loss, nasal discharge, sinus congestion and sore throat.  Cardiovascular: Denied cardiovascular symptoms, arrhythmia, chest pain/pressure, edema, exercise intolerance, orthopnea and palpitations.  Respiratory: Denied pulmonary symptoms, asthma, pleuritic pain, productive sputum, cough, dyspnea and wheezing.  Gastrointestinal:  Denied, gastro-esophageal reflux, melena, nausea and vomiting.  Genitourinary: Denied genitourinary symptoms including symptomatic vaginal discharge, pelvic relaxation issues, and urinary complaints.  Musculoskeletal: Denied musculoskeletal symptoms, stiffness, swelling, muscle weakness and myalgia.  Dermatologic: Denied dermatology symptoms, rash and scar.  Neurologic: Denied neurology symptoms, dizziness, headache, neck pain and syncope.  Psychiatric: Denied psychiatric symptoms, anxiety and depression.  Endocrine: Denied endocrine symptoms including hot flashes and night sweats.   Meds:   Current Outpatient Medications on File Prior to Visit  Medication Sig Dispense Refill  . acetaminophen (TYLENOL) 500 MG tablet Take 1,000 mg by mouth every 6 (six) hours as needed (for pain.).    Marland Kitchen. Doxylamine-Pyridoxine ER (BONJESTA) 20-20 MG TBCR Take 1 capsule by mouth 2 (two) times daily. (Patient not taking: Reported on 03/20/2019) 62 tablet 4   No current facility-administered medications on file prior to visit.     Objective:     Vitals:   04/03/19 0901  BP: 105/74  Pulse: 74                Assessment:    G2P0010 Patient Active Problem List   Diagnosis Date Noted  . History of miscarriage, currently pregnant 02/12/2019     1. Post-operative state   2. Missed ab     Patient doing well.   Plan:            1.  Birth Control I discussed multiple birth control options and methods with the patient.  The risks and benefits of each were reviewed.IUD Literature on Mirena given.  Risks and benefits discussed.  She is considering IUD as an option for birth/cycle control.  Orders No orders  of the defined types were placed in this encounter.   No orders of the defined types were placed in this encounter.     F/U  Return in about 1 week (around 04/10/2019). For IUD insertion Finis Bud, M.D. 04/03/2019 9:26 AM

## 2019-04-11 ENCOUNTER — Ambulatory Visit (INDEPENDENT_AMBULATORY_CARE_PROVIDER_SITE_OTHER): Payer: BC Managed Care – PPO | Admitting: Obstetrics and Gynecology

## 2019-04-11 ENCOUNTER — Encounter: Payer: Self-pay | Admitting: Obstetrics and Gynecology

## 2019-04-11 ENCOUNTER — Other Ambulatory Visit: Payer: Self-pay

## 2019-04-11 VITALS — BP 119/80 | HR 72 | Ht 64.0 in | Wt 205.4 lb

## 2019-04-11 DIAGNOSIS — Z30430 Encounter for insertion of intrauterine contraceptive device: Secondary | ICD-10-CM

## 2019-04-11 NOTE — Progress Notes (Signed)
Patient comes in today for IUD insert.  

## 2019-04-11 NOTE — Progress Notes (Signed)
HPI:      [name] is a 23 y.o. G2P0010 who LMP was Patient's last menstrual period was 12/28/2018 (exact date).  Subjective:   She presents today for IUD insertion.      Hx: The following portions of the patient's history were reviewed and updated as appropriate:             She  has a past medical history of Ovarian cyst and PTSD (post-traumatic stress disorder). She does not have any pertinent problems on file. She  has a past surgical history that includes no history and Dilation and evacuation (N/A, 03/21/2019). Her family history includes Cervical cancer in her mother; Gallstones in her paternal grandmother; Hypertension in her father. She  reports that she has never smoked. She has never used smokeless tobacco. She reports previous alcohol use. She reports previous drug use. Drug: Marijuana. She has a current medication list which includes the following prescription(s): acetaminophen. She has No Known Allergies.       Review of Systems:  Review of Systems  Constitutional: Denied constitutional symptoms, night sweats, recent illness, fatigue, fever, insomnia and weight loss.  Eyes: Denied eye symptoms, eye pain, photophobia, vision change and visual disturbance.  Ears/Nose/Throat/Neck: Denied ear, nose, throat or neck symptoms, hearing loss, nasal discharge, sinus congestion and sore throat.  Cardiovascular: Denied cardiovascular symptoms, arrhythmia, chest pain/pressure, edema, exercise intolerance, orthopnea and palpitations.  Respiratory: Denied pulmonary symptoms, asthma, pleuritic pain, productive sputum, cough, dyspnea and wheezing.  Gastrointestinal: Denied, gastro-esophageal reflux, melena, nausea and vomiting.  Genitourinary: Denied genitourinary symptoms including symptomatic vaginal discharge, pelvic relaxation issues, and urinary complaints.  Musculoskeletal: Denied musculoskeletal symptoms, stiffness, swelling, muscle weakness and myalgia.   Dermatologic: Denied dermatology symptoms, rash and scar.  Neurologic: Denied neurology symptoms, dizziness, headache, neck pain and syncope.  Psychiatric: Denied psychiatric symptoms, anxiety and depression.  Endocrine: Denied endocrine symptoms including hot flashes and night sweats.   Meds:   Current Outpatient Medications on File Prior to Visit  Medication Sig Dispense Refill  . acetaminophen (TYLENOL) 500 MG tablet Take 1,000 mg by mouth every 6 (six) hours as needed (for pain.).     No current facility-administered medications on file prior to visit.     Objective:     Vitals:   04/11/19 0830  BP: 119/80  Pulse: 72    Physical examination   Pelvic:   Vulva: Normal appearance.  No lesions.  Vagina: No lesions or abnormalities noted.  Support: Normal pelvic support.  Urethra No masses tenderness or scarring.  Meatus Normal size without lesions or prolapse.  Cervix: Normal appearance.  No lesions.  Anus: Normal exam.  No lesions.  Perineum: Normal exam.  No lesions.        Bimanual   Uterus: Normal size.  Non-tender.  Mobile.  AV.  Adnexae: No masses.  Non-tender to palpation.  Cul-de-sac: Negative for abnormality.   IUD Procedure Pt has read the booklet and signed the appropriate forms regarding the Mirena IUD.  All of her questions have been answered.   The cervix was cleansed with betadine solution.  After sounding the uterus and noting the position, the IUD was placed in the usual manner without problem.  The string was cut to the appropriate length.  The patient tolerated the procedure well.              Assessment:    G2P0010 Patient Active Problem List   Diagnosis Date Noted  . History of miscarriage, currently pregnant  02/12/2019     1. Encounter for insertion of mirena IUD       Plan:             F/U  Return in about 4 weeks (around 05/09/2019) for For IUD f/u.  Finis Bud, M.D. 04/11/2019 9:10 AM

## 2019-05-20 ENCOUNTER — Ambulatory Visit (INDEPENDENT_AMBULATORY_CARE_PROVIDER_SITE_OTHER): Payer: BC Managed Care – PPO | Admitting: Obstetrics and Gynecology

## 2019-05-20 ENCOUNTER — Encounter: Payer: Self-pay | Admitting: Obstetrics and Gynecology

## 2019-05-20 ENCOUNTER — Other Ambulatory Visit: Payer: Self-pay

## 2019-05-20 VITALS — BP 125/86 | HR 77 | Ht 64.0 in | Wt 207.9 lb

## 2019-05-20 DIAGNOSIS — Z30431 Encounter for routine checking of intrauterine contraceptive device: Secondary | ICD-10-CM | POA: Diagnosis not present

## 2019-05-20 NOTE — Progress Notes (Signed)
HPI:      [name] is a 23 y.o. G2P0020 who LMP was Patient's last menstrual period was 12/28/2018 (exact date).  Subjective:   She presents today for IUD string check.  She has had intercourse and her husband occasionally feels the but this does not seem to be an issue.  She has occasional spotting but it is "minimal".  Overall she is so far happy with her IUD.    Hx: The following portions of the patient's history were reviewed and updated as appropriate:             She  has a past medical history of Ovarian cyst and PTSD (post-traumatic stress disorder). She does not have any pertinent problems on file. She  has a past surgical history that includes no history and Dilation and evacuation (N/A, 03/21/2019). Her family history includes Cervical cancer in her mother; Gallstones in her paternal grandmother; Hypertension in her father. She  reports that she has never smoked. She has never used smokeless tobacco. She reports previous alcohol use. She reports previous drug use. Drug: Marijuana. She has a current medication list which includes the following prescription(s): acetaminophen. She has No Known Allergies.       Review of Systems:  Review of Systems  Constitutional: Denied constitutional symptoms, night sweats, recent illness, fatigue, fever, insomnia and weight loss.  Eyes: Denied eye symptoms, eye pain, photophobia, vision change and visual disturbance.  Ears/Nose/Throat/Neck: Denied ear, nose, throat or neck symptoms, hearing loss, nasal discharge, sinus congestion and sore throat.  Cardiovascular: Denied cardiovascular symptoms, arrhythmia, chest pain/pressure, edema, exercise intolerance, orthopnea and palpitations.  Respiratory: Denied pulmonary symptoms, asthma, pleuritic pain, productive sputum, cough, dyspnea and wheezing.  Gastrointestinal: Denied, gastro-esophageal reflux, melena, nausea and vomiting.  Genitourinary: Denied genitourinary symptoms  including symptomatic vaginal discharge, pelvic relaxation issues, and urinary complaints.  Musculoskeletal: Denied musculoskeletal symptoms, stiffness, swelling, muscle weakness and myalgia.  Dermatologic: Denied dermatology symptoms, rash and scar.  Neurologic: Denied neurology symptoms, dizziness, headache, neck pain and syncope.  Psychiatric: Denied psychiatric symptoms, anxiety and depression.  Endocrine: Denied endocrine symptoms including hot flashes and night sweats.   Meds:   Current Outpatient Medications on File Prior to Visit  Medication Sig Dispense Refill  . acetaminophen (TYLENOL) 500 MG tablet Take 1,000 mg by mouth every 6 (six) hours as needed (for pain.).     No current facility-administered medications on file prior to visit.     Objective:     Vitals:   05/20/19 1358  BP: 125/86  Pulse: 77              Physical examination   Pelvic:   Vulva: Normal appearance.  No lesions.  Vagina: No lesions or abnormalities noted.  Support: Normal pelvic support.  Urethra No masses tenderness or scarring.  Meatus Normal size without lesions or prolapse.  Cervix: Normal appearance.  No lesions. IUD strings noted at cervical os.  Anus: Normal exam.  No lesions.  Perineum: Normal exam.  No lesions.        Bimanual   Uterus: Normal size.  Non-tender.  Mobile.  AV.  Adnexae: No masses.  Non-tender to palpation.  Cul-de-sac: Negative for abnormality.     Assessment:    G2P0020 Patient Active Problem List   Diagnosis Date Noted  . History of miscarriage, currently pregnant 02/12/2019     1. Surveillance of previously prescribed intrauterine contraceptive device        Plan:  1.  I believe the patient should stop spotting in the next week or 2.  If she continues to have difficulty with spotting consider a 1 month supply of OCPs for temporary relief. Orders No orders of the defined types were placed in this encounter.   No orders of the defined  types were placed in this encounter.     F/U  Return for Annual Physical. I spent 16 minutes involved in the care of this patient of which greater than 50% was spent discussing IUD, breakthrough bleeding, string length and changing the string length based on difficulty with intercourse.  All questions answered.  Elonda Husky, M.D. 05/20/2019 2:16 PM

## 2019-05-20 NOTE — Progress Notes (Signed)
Patient comes in today for IUD check.

## 2020-02-28 ENCOUNTER — Encounter (HOSPITAL_COMMUNITY): Payer: Self-pay

## 2020-02-28 ENCOUNTER — Other Ambulatory Visit: Payer: Self-pay

## 2020-02-28 ENCOUNTER — Ambulatory Visit (HOSPITAL_COMMUNITY)
Admission: EM | Admit: 2020-02-28 | Discharge: 2020-02-28 | Disposition: A | Discharge status: Home / Self Care | Payer: 59 | Attending: Urgent Care | Admitting: Urgent Care

## 2020-02-28 DIAGNOSIS — N898 Other specified noninflammatory disorders of vagina: Secondary | ICD-10-CM | POA: Diagnosis not present

## 2020-02-28 DIAGNOSIS — Z3202 Encounter for pregnancy test, result negative: Secondary | ICD-10-CM | POA: Diagnosis not present

## 2020-02-28 LAB — POC URINE PREG, ED: Preg Test, Ur: NEGATIVE

## 2020-02-28 LAB — POCT URINALYSIS DIP (DEVICE)
Bilirubin Urine: NEGATIVE
Glucose, UA: NEGATIVE mg/dL
Hgb urine dipstick: NEGATIVE
Ketones, ur: NEGATIVE mg/dL
Leukocytes,Ua: NEGATIVE
Nitrite: NEGATIVE
Protein, ur: NEGATIVE mg/dL
Specific Gravity, Urine: >=1.03 (ref 1.005–1.030)
Urobilinogen, UA: 0.2 mg/dL (ref 0.0–1.0)
pH: 5.5 (ref 5.0–8.0)

## 2020-02-28 NOTE — Discharge Instructions (Signed)
We have sent your swab and will call you if anything requires treatment  Follow up with your primary care and establish with an ob/gyn, number given

## 2020-02-28 NOTE — ED Provider Notes (Signed)
[facility]    CSN: [id number] Arrival date & time: 02/28/20  1414      History   Chief Complaint Chief Complaint  Patient presents with  . vaginal itching    HPI Sarah Nichols is a 24 y.o. female.   Patient presents for 3 to 4-day onset of vaginal irritation and itching.  She denies noting any discharge.  She reports an odor that she cannot define.  Denies vaginal pain.  She reports she was treated for bacterial vaginosis early in the month and completed treatment on the 18th.  She reports she had complete resolution of symptoms up until few days ago.  This was her first time having a bacterial vaginosis.  She is unsure if she might be having a yeast infection or different type of infection today.  Denies painful urination, frequency or urgency.  Denies abdominal pain.  No fevers or chills.  She is on Mirena IUD and does not have regular menses.  Patient is sexually active only with her spouse  Patient states symptoms not overly bothersome, however she is just concerned about symptoms getting bad like it did last time.     Past Medical History:  Diagnosis Date  . Ovarian cyst   . PTSD (post-traumatic stress disorder)     Patient Active Problem List   Diagnosis Date Noted  . History of miscarriage, currently pregnant 02/12/2019    Past Surgical History:  Procedure Laterality Date  . DILATION AND EVACUATION N/A 03/21/2019   Procedure: DILATATION AND EVACUATION;  Surgeon: Linzie Collin, MD;  Location: [facility];  Service: Gynecology;  Laterality: N/A;  . no history      OB History    Gravida  2   Para      Term      Preterm      AB  2   Living        SAB  2   TAB      Ectopic      Multiple      Live Births               Home Medications    Prior to Admission medications   Medication Sig Start Date End Date Taking? Authorizing Provider  levonorgestrel (MIRENA) 20 MCG/24HR IUD 1 each by Intrauterine route once.   Yes  [provider]  acetaminophen (TYLENOL) 500 MG tablet Take 1,000 mg by mouth every 6 (six) hours as needed (for pain.).    [provider]  etonogestrel-ethinyl estradiol (NUVARING) 0.12-0.015 MG/24HR vaginal ring Place vaginally.    [provider]    Family History Family History  Problem Relation Age of Onset  . Cervical cancer Mother   . Hypertension Father   . Gallstones Paternal Grandmother     Social History Social History   Tobacco Use  . Smoking status: Never Smoker  . Smokeless tobacco: Never Used  Vaping Use  . Vaping Use: Never used  Substance Use Topics  . Alcohol use: Not Currently  . Drug use: Not Currently    Types: Marijuana     Allergies   Patient has no known allergies.   Review of Systems Review of Systems   Physical Exam Triage Vital Signs ED Triage Vitals [02/28/20 1519]  Enc Vitals Group     BP (!) 124/86     Pulse Rate 70     Resp 16     Temp 98.5 F (36.9 C)  Temp Source Oral     SpO2 100 %     Weight (!) 206 lb (93.4 kg)     Height 5\' 4"  (1.626 m)     Head Circumference      Peak Flow      Pain Score 0     Pain Loc      Pain Edu?      Excl. in GC?    No data found.  Updated Vital Signs BP (!) 124/86   Pulse 70   Temp 98.5 F (36.9 C) (Oral)   Resp 16   Ht 5\' 4"  (1.626 m)   Wt (!) 206 lb (93.4 kg)   SpO2 100%   BMI 35.36 kg/m   Visual Acuity Right Eye Distance:   Left Eye Distance:   Bilateral Distance:    Right Eye Near:   Left Eye Near:    Bilateral Near:     Physical Exam Vitals and nursing note reviewed.  Constitutional:      General: She is not in acute distress.    Appearance: Normal appearance. She is not ill-appearing.  Abdominal:     General: Abdomen is flat.     Tenderness: There is no abdominal tenderness.  Genitourinary:    Comments: Deferred for self swab Neurological:     Mental Status: She is alert.      UC Treatments / Results  Labs (all labs ordered  are listed, but only abnormal results are displayed) Labs Reviewed  POC URINE PREG, ED  POCT URINALYSIS DIP (DEVICE)  CERVICOVAGINAL ANCILLARY ONLY    EKG   Radiology No results found.  Procedures Procedures (including critical care time)  Medications Ordered in UC Medications - No data to display  Initial Impression / Assessment and Plan / UC Course  I have reviewed the triage vital signs and the nursing notes.  Pertinent labs & imaging results that were available during my care of the patient were reviewed by me and considered in my medical decision making (see chart for details).     #Vaginal irritation Patient is a 24 year old presenting with vaginal irritation.  Critical history is not overly consistent with on etiology or the other.  Discussed with patient that we will be best to wait for the results of the tests in order to do more directed therapy.  She is agreeable to this.  Swab sent.  OB/GYN follow-up option given.  Patient verbalized understanding plan of care Final Clinical Impressions(s) / UC Diagnoses   Final diagnoses:  Vaginal irritation     Discharge Instructions     We have sent your swab and will call you if anything requires treatment  Follow up with your primary care and establish with an ob/gyn, number given      ED Prescriptions    None     PDMP not reviewed this encounter.   , PA-C 02/28/20 [phone]

## 2020-02-28 NOTE — ED Triage Notes (Signed)
Pt c/o vaginal itching, white vaginal discharge and an odorx2 days.

## 2020-03-01 LAB — CERVICOVAGINAL ANCILLARY ONLY
Bacterial Vaginitis (gardnerella): NEGATIVE
Candida Glabrata: NEGATIVE
Candida Vaginitis: POSITIVE — AB
Chlamydia: POSITIVE — AB
Comment: NEGATIVE
Comment: NEGATIVE
Comment: NEGATIVE
Comment: NEGATIVE
Comment: NEGATIVE
Comment: NORMAL
Neisseria Gonorrhea: NEGATIVE
Trichomonas: NEGATIVE

## 2020-03-03 ENCOUNTER — Telehealth (HOSPITAL_COMMUNITY): Payer: Self-pay

## 2020-03-03 DIAGNOSIS — B3731 Acute candidiasis of vulva and vagina: Secondary | ICD-10-CM

## 2020-03-03 DIAGNOSIS — A749 Chlamydial infection, unspecified: Secondary | ICD-10-CM

## 2020-03-03 MED ORDER — FLUCONAZOLE 150 MG PO TABS: ORAL_TABLET | ORAL | 0 refills | Status: DC

## 2020-03-03 MED ORDER — AZITHROMYCIN 500 MG PO TABS: ORAL_TABLET | ORAL | 0 refills | Status: DC

## 2020-04-01 ENCOUNTER — Other Ambulatory Visit: Payer: Self-pay

## 2020-04-01 ENCOUNTER — Other Ambulatory Visit: Payer: Self-pay | Admitting: Critical Care Medicine

## 2020-04-01 DIAGNOSIS — Z20822 Contact with and (suspected) exposure to covid-19: Secondary | ICD-10-CM | POA: Diagnosis not present

## 2020-04-02 ENCOUNTER — Other Ambulatory Visit: Payer: 59

## 2020-04-03 LAB — NOVEL CORONAVIRUS, NAA: SARS-CoV-2, NAA: NOT DETECTED

## 2020-05-06 ENCOUNTER — Encounter (HOSPITAL_COMMUNITY): Payer: Self-pay

## 2020-05-06 ENCOUNTER — Ambulatory Visit (HOSPITAL_COMMUNITY)
Admission: EM | Admit: 2020-05-06 | Discharge: 2020-05-06 | Disposition: A | Discharge status: Home / Self Care | Payer: BC Managed Care – PPO | Attending: Internal Medicine | Admitting: Internal Medicine

## 2020-05-06 ENCOUNTER — Other Ambulatory Visit: Payer: Self-pay

## 2020-05-06 DIAGNOSIS — Z793 Long term (current) use of hormonal contraceptives: Secondary | ICD-10-CM | POA: Diagnosis not present

## 2020-05-06 DIAGNOSIS — R109 Unspecified abdominal pain: Secondary | ICD-10-CM | POA: Diagnosis not present

## 2020-05-06 DIAGNOSIS — N760 Acute vaginitis: Secondary | ICD-10-CM

## 2020-05-06 DIAGNOSIS — N898 Other specified noninflammatory disorders of vagina: Secondary | ICD-10-CM | POA: Diagnosis not present

## 2020-05-06 LAB — POCT URINALYSIS DIPSTICK, ED / UC
Bilirubin Urine: NEGATIVE
Glucose, UA: NEGATIVE mg/dL
Hgb urine dipstick: NEGATIVE
Ketones, ur: NEGATIVE mg/dL
Leukocytes,Ua: NEGATIVE
Nitrite: NEGATIVE
Protein, ur: NEGATIVE mg/dL
Specific Gravity, Urine: 1.02 (ref 1.005–1.030)
Urobilinogen, UA: 0.2 mg/dL (ref 0.0–1.0)
pH: 8.5 — ABNORMAL HIGH (ref 5.0–8.0)

## 2020-05-06 LAB — RPR: RPR Ser Ql: NONREACTIVE

## 2020-05-06 LAB — HIV ANTIBODY (ROUTINE TESTING W REFLEX): HIV Screen 4th Generation wRfx: NONREACTIVE

## 2020-05-06 NOTE — ED Provider Notes (Signed)
[facility]    CSN: [id number] Arrival date & time: 05/06/20  7425      History   Chief Complaint Chief Complaint  Patient presents with  . Vaginitis  . Vaginal Discharge    HPI Sarah Nichols is a 24 y.o. female presenting to the urgent care clinic with concerning vaginal discharge and pain.  Patient has history of chlamydia and Candida vaginitis that occurred 2 months ago.  She reports that about 1/2 weeks ago she started having vaginal itch and irritation.  She was prescribed one round of Diflucan which made her symptoms better, however the symptoms have continued/returned.  She primarily describes vaginal itch and vulvar discomfort.  She says it feels more similar to bacterial vaginosis, but continues to have itch.  Describes vaginal discharge that is thin, denies any fishy odor but does notice an odor that is stronger than usual.  Also reports some cramps  Last menstrual period was in September 2020 when her Mirena IUD was placed.  HPI  Past Medical History:  Diagnosis Date  . Ovarian cyst   . PTSD (post-traumatic stress disorder)     Patient Active Problem List   Diagnosis Date Noted  . History of miscarriage, currently pregnant 02/12/2019    Past Surgical History:  Procedure Laterality Date  . DILATION AND EVACUATION N/A 03/21/2019   Procedure: DILATATION AND EVACUATION;  Surgeon: Linzie Collin, MD;  Location: [facility];  Service: Gynecology;  Laterality: N/A;  . no history      OB History    Gravida  2   Para      Term      Preterm      AB  2   Living        SAB  2   TAB      Ectopic      Multiple      Live Births               Home Medications    Prior to Admission medications   Medication Sig Start Date End Date Taking? Authorizing Provider  acetaminophen (TYLENOL) 500 MG tablet Take 1,000 mg by mouth every 6 (six) hours as needed (for pain.).    [provider]  azithromycin (ZITHROMAX) 500 MG  tablet Take two tablets for a total of 1,000 mg. 03/03/20   Lamptey, Britta Mccreedy, MD  etonogestrel-ethinyl estradiol (NUVARING) 0.12-0.015 MG/24HR vaginal ring Place vaginally.    [provider]  fluconazole (DIFLUCAN) 150 MG tablet Take 1 tablet.  If you are still having symptoms after three days, take the second tablet. 03/03/20   LampteyBritta Mccreedy, MD  levonorgestrel (MIRENA) 20 MCG/24HR IUD 1 each by Intrauterine route once.    [provider]    Family History Family History  Problem Relation Age of Onset  . Cervical cancer Mother   . Hypertension Father   . Gallstones Paternal Grandmother     Social History Social History   Tobacco Use  . Smoking status: Never Smoker  . Smokeless tobacco: Never Used  Vaping Use  . Vaping Use: Never used  Substance Use Topics  . Alcohol use: Not Currently  . Drug use: Not Currently    Types: Marijuana     Allergies   Patient has no known allergies.   Review of Systems Review of Systems - See HPI   Physical Exam Triage Vital Signs ED Triage Vitals  Enc Vitals Group     BP 05/06/20 9563  133/83     Pulse Rate 05/06/20 0922 82     Resp 05/06/20 0922 18     Temp 05/06/20 0922 99.1 F (37.3 C)     Temp Source 05/06/20 0922 Oral     SpO2 05/06/20 0922 100 %     Weight --      Height --      Head Circumference --      Peak Flow --      Pain Score 05/06/20 0924 3     Pain Loc --      Pain Edu? --      Excl. in GC? --    No data found.  Updated Vital Signs BP 133/83 (BP Location: Left Arm)   Pulse 82   Temp 99.1 F (37.3 C) (Oral)   Resp 18   SpO2 100%   Physical exam: General: Pleasant patient, no apparent distress, nontoxic. Respiratory: Comfortable work of breathing, speaking complete sentences Integumentary: No rashes appreciated   UC Treatments / Results  Labs (all labs ordered are listed, but only abnormal results are displayed) Labs Reviewed - No data to display   Medications Ordered in  UC Medications - No data to display  Initial Impression / Assessment and Plan / UC Course  I have reviewed the triage vital signs and the nursing notes.  Pertinent labs & imaging results that were available during my care of the patient were reviewed by me and considered in my medical decision making (see chart for details).  Vaginitis: Patient experiencing vaginitis of undetermined cause at this point -Checking patient for gonorrhea, chlamydia, trichomonas, bacterial vaginosis, yeast infection -Also checking for HIV and RPR -We will contact patient at [phone] with results and treat as needed   Final Clinical Impressions(s) / UC Diagnoses   Final diagnoses:  None   Discharge Instructions   None    ED Prescriptions    None     PDMP not reviewed this encounter.   Peggyann Shoals, DO [facility], PGY-3 05/06/2020 10:05 AM    Peggyann Shoals C, DO 05/06/20 1052

## 2020-05-06 NOTE — Discharge Instructions (Addendum)
Thank you for coming in to see [place] today! Please see below to review our plan for today's visit:  1.  We are checking today for gonorrhea, chlamydia, trichomonas, bacterial vaginosis, Candida, as well as for any sign of urinary tract infection. We will call you with results and treat as needed.   It was our pleasure to serve you!   Dr. Peggyann Shoals [facility]

## 2020-05-06 NOTE — ED Triage Notes (Signed)
Pt presents with vaginal irritation, abnormal vaginal odor, vaginal discharge, and vaginal itchiness X 1 week.   Pt states she was treated for a yeast infection a few days ago but still having same symptoms.

## 2020-05-07 DIAGNOSIS — F4325 Adjustment disorder with mixed disturbance of emotions and conduct: Secondary | ICD-10-CM | POA: Diagnosis not present

## 2020-05-07 LAB — CERVICOVAGINAL ANCILLARY ONLY
Candida Glabrata: NEGATIVE
Candida Vaginitis: NEGATIVE
Chlamydia: NEGATIVE
Comment: NEGATIVE
Comment: NEGATIVE
Comment: NEGATIVE
Comment: NEGATIVE
Comment: NORMAL
Neisseria Gonorrhea: NEGATIVE
Trichomonas: NEGATIVE

## 2020-05-07 NOTE — Progress Notes (Signed)
Unable to get a result on BV due to insufficient sample.  Patient made aware, will return for recollect sometime this weekend.

## 2020-05-10 ENCOUNTER — Emergency Department (HOSPITAL_COMMUNITY)
Admission: EM | Admit: 2020-05-10 | Discharge: 2020-05-11 | Disposition: A | Discharge status: Home / Self Care | Payer: BC Managed Care – PPO | Attending: Emergency Medicine | Admitting: Emergency Medicine

## 2020-05-10 DIAGNOSIS — M549 Dorsalgia, unspecified: Secondary | ICD-10-CM | POA: Diagnosis not present

## 2020-05-10 DIAGNOSIS — M5441 Lumbago with sciatica, right side: Secondary | ICD-10-CM | POA: Insufficient documentation

## 2020-05-10 DIAGNOSIS — R1031 Right lower quadrant pain: Secondary | ICD-10-CM | POA: Insufficient documentation

## 2020-05-10 DIAGNOSIS — R110 Nausea: Secondary | ICD-10-CM | POA: Insufficient documentation

## 2020-05-10 DIAGNOSIS — R1032 Left lower quadrant pain: Secondary | ICD-10-CM | POA: Insufficient documentation

## 2020-05-10 LAB — I-STAT BETA HCG BLOOD, ED (MC, WL, AP ONLY): I-stat hCG, quantitative: <5 m[IU]/mL (ref <5)

## 2020-05-10 LAB — COMPREHENSIVE METABOLIC PANEL
ALT: 12 U/L (ref 0–44)
AST: 16 U/L (ref 15–41)
Albumin: 3.8 g/dL (ref 3.5–5.0)
Alkaline Phosphatase: 58 U/L (ref 38–126)
Anion gap: 9 (ref 5–15)
BUN: 7 mg/dL (ref 6–20)
CO2: 25 mmol/L (ref 22–32)
Calcium: 9.1 mg/dL (ref 8.9–10.3)
Chloride: 107 mmol/L (ref 98–111)
Creatinine, Ser: 0.78 mg/dL (ref 0.44–1.00)
GFR, Estimated: >60 mL/min (ref >60)
Glucose, Bld: 91 mg/dL (ref 70–99)
Potassium: 4 mmol/L (ref 3.5–5.1)
Sodium: 141 mmol/L (ref 135–145)
Total Bilirubin: 0.8 mg/dL (ref 0.3–1.2)
Total Protein: 6.7 g/dL (ref 6.5–8.1)

## 2020-05-10 LAB — URINALYSIS, ROUTINE W REFLEX MICROSCOPIC
Bacteria, UA: NONE SEEN
Bilirubin Urine: NEGATIVE
Glucose, UA: NEGATIVE mg/dL
Hgb urine dipstick: NEGATIVE
Ketones, ur: 20 mg/dL — AB
Nitrite: NEGATIVE
Protein, ur: NEGATIVE mg/dL
Specific Gravity, Urine: 1.021 (ref 1.005–1.030)
pH: 5 (ref 5.0–8.0)

## 2020-05-10 LAB — CBC
HCT: 44.3 % (ref 36.0–46.0)
Hemoglobin: 14.1 g/dL (ref 12.0–15.0)
MCH: 26.8 pg (ref 26.0–34.0)
MCHC: 31.8 g/dL (ref 30.0–36.0)
MCV: 84.1 fL (ref 80.0–100.0)
Platelets: 252 10*3/uL (ref 150–400)
RBC: 5.27 MIL/uL — ABNORMAL HIGH (ref 3.87–5.11)
RDW: 12.7 % (ref 11.5–15.5)
WBC: 6.2 10*3/uL (ref 4.0–10.5)
nRBC: 0 % (ref 0.0–0.2)

## 2020-05-10 LAB — LIPASE, BLOOD: Lipase: 23 U/L (ref 11–51)

## 2020-05-10 MED ORDER — OXYCODONE-ACETAMINOPHEN 5-325 MG PO TABS
1.0000 | ORAL_TABLET | ORAL | Status: DC | PRN
  Administered 2020-05-10: 1 via ORAL
  Filled 2020-05-10: qty 1

## 2020-05-10 NOTE — ED Triage Notes (Signed)
Pt arrives to ED with c/o of low right back pain that started around 7am this morning seems to go into lower abd on both sides. Pt describes this has a sharp pain. Pt denies any v/d but does feel nausea. Pt had IUD so unsure of LMP.

## 2020-05-11 ENCOUNTER — Other Ambulatory Visit: Payer: Self-pay

## 2020-05-11 DIAGNOSIS — F4325 Adjustment disorder with mixed disturbance of emotions and conduct: Secondary | ICD-10-CM | POA: Diagnosis not present

## 2020-05-11 MED ORDER — PREDNISONE 20 MG PO TABS
60.0000 mg | ORAL_TABLET | Freq: Once | ORAL | Status: AC
  Administered 2020-05-11: 60 mg via ORAL
  Filled 2020-05-11: qty 3

## 2020-05-11 MED ORDER — IBUPROFEN 400 MG PO TABS
600.0000 mg | ORAL_TABLET | Freq: Once | ORAL | Status: AC
  Administered 2020-05-11: 600 mg via ORAL
  Filled 2020-05-11: qty 1

## 2020-05-11 MED ORDER — CYCLOBENZAPRINE HCL 10 MG PO TABS: 10.0000 mg | ORAL_TABLET | Freq: Two times a day (BID) | ORAL | 0 refills | Status: DC | PRN

## 2020-05-11 MED ORDER — IBUPROFEN 600 MG PO TABS: 600.0000 mg | ORAL_TABLET | Freq: Four times a day (QID) | ORAL | 0 refills | Status: DC | PRN

## 2020-05-11 MED ORDER — PREDNISONE 20 MG PO TABS: 20.0000 mg | ORAL_TABLET | Freq: Every day | ORAL | 0 refills | Status: AC

## 2020-05-11 NOTE — Discharge Instructions (Addendum)
Please read instructions below.  You can take 600 mg of ibuprofen every 6 hours as needed for pain.   Apply ice to your back for 20 minutes at a time.  You can also apply heat if this provides more relief.   Starting tomorrow, take the prednisone daily until gone. You can take Flexeril/cyclobenzaprine every 12 hours as needed for muscle spasm.  Be aware this medication can make you drowsy; do not take while driving or drinking alcohol.   Follow-up with your primary care provider symptoms persist.   You can establish care with the [facility] for your GYN and OB needs. Return to ER if new numbness or tingling in your arms or legs, inability to urinate, inability to hold your bowels, or weakness in your extremities.

## 2020-05-11 NOTE — ED Provider Notes (Signed)
[facility] EMERGENCY DEPARTMENT Provider Note   CSN: [id number] Arrival date & time: 05/10/20  1215     History Chief Complaint  Patient presents with  . Abdominal Pain    Sarah Nichols is a 24 y.o. female w PMHx ovarian cysts, presenting to the ED with complaint of right sided lower back pain that began upon awakening yesterday morning. She reports pain is constant with intermittent sharp radiation down her posterior right leg with particular movements, sometimes to lower abdomen with a cramping pain. Patient states pain feels somewhat similar to prior ruptured ovarian cyst. She did have some radiation down her leg during that time as well. She had some vaginal spotting while in the waiting room, no abnl discharge.  No LE numbness, saddle anesthesia, urinary sx, bowel/bladder incontinence, F/C, diarrhea, constipation. Endorses some nausea attributed to the pain but no vomiting. No recent trauma or strenous activity.  The history is provided by the patient.       Past Medical History:  Diagnosis Date  . Ovarian cyst   . PTSD (post-traumatic stress disorder)     Patient Active Problem List   Diagnosis Date Noted  . History of miscarriage, currently pregnant 02/12/2019    Past Surgical History:  Procedure Laterality Date  . DILATION AND EVACUATION N/A 03/21/2019   Procedure: DILATATION AND EVACUATION;  Surgeon: Linzie Collin, MD;  Location: [facility];  Service: Gynecology;  Laterality: N/A;  . no history       OB History    Gravida  2   Para      Term      Preterm      AB  2   Living        SAB  2   TAB      Ectopic      Multiple      Live Births              Family History  Problem Relation Age of Onset  . Cervical cancer Mother   . Hypertension Father   . Gallstones Paternal Grandmother     Social History   Tobacco Use  . Smoking status: Never Smoker  . Smokeless tobacco: Never Used  Vaping Use  .  Vaping Use: Never used  Substance Use Topics  . Alcohol use: Not Currently  . Drug use: Not Currently    Types: Marijuana    Home Medications Prior to Admission medications   Medication Sig Start Date End Date Taking? Authorizing Provider  acetaminophen (TYLENOL) 500 MG tablet Take 1,000 mg by mouth every 6 (six) hours as needed (for pain.).    [provider]  azithromycin (ZITHROMAX) 500 MG tablet Take two tablets for a total of 1,000 mg. 03/03/20   Lamptey, Britta Mccreedy, MD  cyclobenzaprine (FLEXERIL) 10 MG tablet Take 1 tablet (10 mg total) by mouth 2 (two) times daily as needed for muscle spasms. 05/11/20   Sinahi Knights, [place] N, PA-C  etonogestrel-ethinyl estradiol (NUVARING) 0.12-0.015 MG/24HR vaginal ring Place vaginally.    [provider]  fluconazole (DIFLUCAN) 150 MG tablet Take 1 tablet.  If you are still having symptoms after three days, take the second tablet. 03/03/20   Lamptey, Britta Mccreedy, MD  ibuprofen (ADVIL) 600 MG tablet Take 1 tablet (600 mg total) by mouth every 6 (six) hours as needed. 05/11/20   Cristol Engdahl, [place] N, PA-C  levonorgestrel (MIRENA) 20 MCG/24HR IUD 1 each by Intrauterine route once.    [provider]  predniSONE (DELTASONE) 20 MG tablet Take 1 tablet (20 mg total) by mouth daily for 4 days. 05/12/20 05/16/20  Tashunda Vandezande, [place] N, PA-C    Allergies    Patient has no known allergies.  Review of Systems   Review of Systems  Constitutional: Negative for chills and fever.  Gastrointestinal: Positive for abdominal pain and nausea. Negative for constipation, diarrhea and vomiting.  Genitourinary: Positive for vaginal bleeding. Negative for dysuria, frequency, hematuria and vaginal discharge.  Musculoskeletal: Positive for back pain.  Neurological: Negative for weakness and numbness.  All other systems reviewed and are negative.   Physical Exam Updated Vital Signs BP 118/81 (BP Location: Right Arm)   Pulse 68   Temp 97.6 F (36.4 C)  (Oral)   Resp 18   Ht 5\' 4"  (1.626 m)   Wt 93 kg   SpO2 99%   BMI 35.19 kg/m   Physical Exam Vitals and nursing note reviewed.  Constitutional:      General: She is not in acute distress.    Appearance: She is well-developed. She is not ill-appearing.  HENT:     Head: Normocephalic and atraumatic.  Eyes:     Conjunctiva/sclera: Conjunctivae normal.  Cardiovascular:     Rate and Rhythm: Normal rate and regular rhythm.  Pulmonary:     Effort: Pulmonary effort is normal. No respiratory distress.     Breath sounds: Normal breath sounds.  Abdominal:     General: Abdomen is flat. Bowel sounds are normal.     Palpations: Abdomen is soft.     Tenderness: There is no abdominal tenderness. There is no right CVA tenderness, left CVA tenderness or guarding.  Musculoskeletal:     Comments: TTP to right sacral/upper gluteal region. No skin changes. No midline spinal or paraspinal TTP. Positive straight leg raise on the right.  Skin:    General: Skin is warm.  Neurological:     Mental Status: She is alert.     Comments: Normal tone.  5/5 strength in BLE including strong and equal dorsiflexion/plantar flexion Sensory:  light touch normal in BLE extremities.   Gait: normal gait and balance CV: distal pulses palpable throughout    Psychiatric:        Behavior: Behavior normal.     ED Results / Procedures / Treatments   Labs (all labs ordered are listed, but only abnormal results are displayed) Labs Reviewed  CBC - Abnormal; Notable for the following components:      Result Value   RBC 5.27 (*)    All other components within normal limits  URINALYSIS, ROUTINE W REFLEX MICROSCOPIC - Abnormal; Notable for the following components:   Ketones, ur 20 (*)    Leukocytes,Ua TRACE (*)    All other components within normal limits  LIPASE, BLOOD  COMPREHENSIVE METABOLIC PANEL  I-STAT BETA HCG BLOOD, ED ([facility], [facility], [facility] ONLY)    EKG None  Radiology No results  found.  Procedures Procedures (including critical care time)  Medications Ordered in ED Medications  oxyCODONE-acetaminophen (PERCOCET/ROXICET) 5-325 MG per tablet 1 tablet (1 tablet Oral Given 05/10/20 2239)  ibuprofen (ADVIL) tablet 600 mg (600 mg Oral Given 05/11/20 0810)  predniSONE (DELTASONE) tablet 60 mg (60 mg Oral Given 05/11/20 07/11/20)    ED Course  I have reviewed the triage vital signs and the nursing notes.  Pertinent labs & imaging results that were available during my care of the patient were reviewed by me and considered in my medical  decision making (see chart for details).    MDM Rules/Calculators/A&P                          Patient with history of ovarian cysts, presenting to the emergency department with right lower back pain with intermittent radicular pain down the posterior right leg.  Pain began upon awakening yesterday morning.  The radicular pain is intermittent with particular movements.  She states she had some mild intermittent bilateral lower abdominal cramping that felt similar to ovarian cyst rupture.  She states when she had her ovarian cyst rupture in the past and at the time she did have some intermittent pain down her right leg though she was thought to have a sciatica at the time as well.  Unfortunately patient had extended wait in the waiting room however on evaluation she is very well-appearing and in no distress.  Her pain was resolved with the dose of oxycodone.  She is afebrile with normal vital signs.  Abdomen is soft and nontender.  She was seen in urgent care earlier this month for vaginal discharge and itching though states this has resolved - vaginal swab was unremarkable. Do not believe pelvic exam is necessary at this time. She does have tenderness to the right sacral/gluteal region and positive straight leg raise.  Neurovascularly intact.  Presentation is not concerning for cauda equina or emergent intra-abdominal pathology such as acute  appendicitis or ovarian torsion given reassuring exam and workup.  Labs are reassuring without leukocytosis or electrolyte derangement.  Pregnancy test is negative.  UA without blood or signs of infection.  Symptoms seem most likely to be musculoskeletal in nature, likely a sciatica.  Given patient's history of ruptured ovarian cyst, recommend she treat symptomatically and follow up outpatient.    Will prescribe NSAIDs, will also prescribe Flexeril for musculoskeletal etiology, and a burst of prednisone.  She is given referral to OB/GYN, she states her GYN is currently in [place] and is too far of a drive. Discussed strict return precautions, patient is agreeable with workup and care plan at this time.  Final Clinical Impression(s) / ED Diagnoses Final diagnoses:  Acute right-sided low back pain with right-sided sciatica    Rx / DC Orders ED Discharge Orders         Ordered    predniSONE (DELTASONE) 20 MG tablet  Daily        05/11/20 0756    ibuprofen (ADVIL) 600 MG tablet  Every 6 hours PRN        05/11/20 0756    cyclobenzaprine (FLEXERIL) 10 MG tablet  2 times daily PRN        05/11/20 0756           Min Collymore, [place] N, PA-C 05/11/20 6599    Pricilla Loveless, MD 05/11/20 1154

## 2020-05-13 DIAGNOSIS — Z8742 Personal history of other diseases of the female genital tract: Secondary | ICD-10-CM | POA: Diagnosis not present

## 2020-05-13 DIAGNOSIS — M5441 Lumbago with sciatica, right side: Secondary | ICD-10-CM | POA: Diagnosis not present

## 2020-05-13 DIAGNOSIS — Z23 Encounter for immunization: Secondary | ICD-10-CM | POA: Diagnosis not present

## 2020-05-19 ENCOUNTER — Encounter: Payer: Self-pay | Admitting: Obstetrics and Gynecology

## 2020-05-20 ENCOUNTER — Encounter: Payer: Self-pay | Admitting: Obstetrics and Gynecology

## 2020-05-26 DIAGNOSIS — F4325 Adjustment disorder with mixed disturbance of emotions and conduct: Secondary | ICD-10-CM | POA: Diagnosis not present

## 2020-05-27 ENCOUNTER — Ambulatory Visit (INDEPENDENT_AMBULATORY_CARE_PROVIDER_SITE_OTHER): Payer: BC Managed Care – PPO | Admitting: Obstetrics and Gynecology

## 2020-05-27 ENCOUNTER — Encounter: Payer: Self-pay | Admitting: Obstetrics and Gynecology

## 2020-05-27 ENCOUNTER — Other Ambulatory Visit: Payer: Self-pay

## 2020-05-27 VITALS — BP 128/88 | HR 78 | Ht 64.0 in | Wt 202.8 lb

## 2020-05-27 DIAGNOSIS — N898 Other specified noninflammatory disorders of vagina: Secondary | ICD-10-CM | POA: Diagnosis not present

## 2020-05-27 DIAGNOSIS — N941 Unspecified dyspareunia: Secondary | ICD-10-CM

## 2020-05-27 DIAGNOSIS — N9089 Other specified noninflammatory disorders of vulva and perineum: Secondary | ICD-10-CM

## 2020-05-27 NOTE — Progress Notes (Signed)
HPI:      Sarah Nichols is a 24 y.o. G2P0020 who LMP was No LMP recorded (lmp unknown). (Menstrual status: IUD).  Subjective:   She presents today because she states over the last week she has been having pain with intercourse.  She reports initial pain with insertion but then it gets somewhat better.  States that lubrication is not the issue. Of significant note she went to urgent care a few weeks ago and was found to have BV and yeast.  She was treated appropriately and says her symptoms resolved.  She says that she was tested for STDs at that time.  They were negative. She has no concerns regarding her partner.     Hx: The following portions of the patient's history were reviewed and updated as appropriate:             She  has a past medical history of Ovarian cyst and PTSD (post-traumatic stress disorder). She does not have any pertinent problems on file. She  has a past surgical history that includes no history and Dilation and evacuation (N/A, 03/21/2019). Her family history includes Cervical cancer in her mother; Gallstones in her paternal grandmother; Hypertension in her father. She  reports that she has never smoked. She has never used smokeless tobacco. She reports previous alcohol use. She reports previous drug use. Drug: Marijuana. She has a current medication list which includes the following prescription(s): acetaminophen and levonorgestrel. She has No Known Allergies.       Review of Systems:  Review of Systems  Constitutional: Denied constitutional symptoms, night sweats, recent illness, fatigue, fever, insomnia and weight loss.  Eyes: Denied eye symptoms, eye pain, photophobia, vision change and visual disturbance.  Ears/Nose/Throat/Neck: Denied ear, nose, throat or neck symptoms, hearing loss, nasal discharge, sinus congestion and sore throat.  Cardiovascular: Denied cardiovascular symptoms, arrhythmia, chest pain/pressure, edema, exercise intolerance,  orthopnea and palpitations.  Respiratory: Denied pulmonary symptoms, asthma, pleuritic pain, productive sputum, cough, dyspnea and wheezing.  Gastrointestinal: Denied, gastro-esophageal reflux, melena, nausea and vomiting.  Genitourinary: See HPI for additional information.  Musculoskeletal: Denied musculoskeletal symptoms, stiffness, swelling, muscle weakness and myalgia.  Dermatologic: Denied dermatology symptoms, rash and scar.  Neurologic: Denied neurology symptoms, dizziness, headache, neck pain and syncope.  Psychiatric: Denied psychiatric symptoms, anxiety and depression.  Endocrine: Denied endocrine symptoms including hot flashes and night sweats.   Meds:   Current Outpatient Medications on File Prior to Visit  Medication Sig Dispense Refill  . acetaminophen (TYLENOL) 500 MG tablet Take 1,000 mg by mouth every 6 (six) hours as needed (for pain.).    Marland Kitchen levonorgestrel (MIRENA) 20 MCG/24HR IUD 1 each by Intrauterine route once.     No current facility-administered medications on file prior to visit.          Objective:     Vitals:   05/27/20 0953  BP: 128/88  Pulse: 78   Filed Weights   05/27/20 0953  Weight: 202 lb 12.8 oz (92 kg)              Physical examination   Pelvic:  Vulva: Normal appearance.  Ulcerated left labial lesion  Vagina: No lesions or abnormalities noted.  Support: Normal pelvic support.  Urethra No masses tenderness or scarring.  Meatus Normal size without lesions or prolapse.  Cervix: Normal appearance.  No lesions.  IUD strings noted at cervical os  Anus: Normal exam.  No lesions.  Perineum: Normal exam.  No lesions.   WET PREP:  clue cells: absent, KOH (yeast): negative, odor: absent and trichomoniasis: negative Ph:  < 4.5   Assessment:    G2P0020 Patient Active Problem List   Diagnosis Date Noted  . History of miscarriage, currently pregnant 02/12/2019     1. Labial lesion   2. Dyspareunia in female   3. Vaginal discharge      Labial lesion possibly the source of her pain with intercourse/discomfort.  HSV versus excoriation ?  Wet prep negative!!   Plan:            1.  Wait for HSV cultures  2.  Expectant management of vaginal discharge.  3.  Patient to return for annual examination and at that time we will do NuSwab if symptoms remain and HSV negative.   Orders Orders Placed This Encounter  Procedures  . Herpes simplex virus culture    No orders of the defined types were placed in this encounter.     F/U  Return in about 2 weeks (around 06/10/2020) for Annual Physical, We will contact her with any abnormal test results. I spent 22 minutes involved in the care of this patient preparing to see the patient by obtaining and reviewing her medical history (including labs, imaging tests and prior procedures), documenting clinical information in the electronic health record (EHR), counseling and coordinating care plans, writing and sending prescriptions, ordering tests or procedures and directly communicating with the patient by discussing pertinent items from her history and physical exam as well as detailing my assessment and plan as noted above so that she has an informed understanding.  All of her questions were answered.  Elonda Husky, M.D. 05/27/2020 10:27 AM

## 2020-05-30 LAB — HERPES SIMPLEX VIRUS CULTURE

## 2020-06-08 DIAGNOSIS — F4325 Adjustment disorder with mixed disturbance of emotions and conduct: Secondary | ICD-10-CM | POA: Diagnosis not present

## 2020-06-10 ENCOUNTER — Encounter: Payer: Self-pay | Admitting: Obstetrics and Gynecology

## 2020-06-10 ENCOUNTER — Other Ambulatory Visit: Payer: Self-pay

## 2020-06-10 ENCOUNTER — Other Ambulatory Visit (HOSPITAL_COMMUNITY)
Admission: RE | Admit: 2020-06-10 | Discharge: 2020-06-10 | Disposition: A | Discharge status: Home / Self Care | Payer: BC Managed Care – PPO | Source: Ambulatory Visit | Attending: Obstetrics and Gynecology | Admitting: Obstetrics and Gynecology

## 2020-06-10 ENCOUNTER — Ambulatory Visit (INDEPENDENT_AMBULATORY_CARE_PROVIDER_SITE_OTHER): Payer: BC Managed Care – PPO | Admitting: Obstetrics and Gynecology

## 2020-06-10 VITALS — BP 129/85 | HR 73 | Ht 64.0 in | Wt 202.2 lb

## 2020-06-10 DIAGNOSIS — Z3169 Encounter for other general counseling and advice on procreation: Secondary | ICD-10-CM

## 2020-06-10 DIAGNOSIS — Z124 Encounter for screening for malignant neoplasm of cervix: Secondary | ICD-10-CM | POA: Diagnosis not present

## 2020-06-10 DIAGNOSIS — Z01419 Encounter for gynecological examination (general) (routine) without abnormal findings: Secondary | ICD-10-CM

## 2020-06-10 NOTE — Addendum Note (Signed)
Addended by: Dorian Pod on: 06/10/2020 09:51 AM   Modules accepted: Orders

## 2020-06-10 NOTE — Progress Notes (Signed)
HPI:      [name] is a 24 y.o. G2P0020 who LMP was No LMP recorded (lmp unknown). (Menstrual status: IUD).  Subjective:   She presents today for her annual examination.  She reports that her vulvar pain is mostly resolved.  Denies vaginal discharge or other issues.  She is considering conception in the next month and would like her IUD removed. She does occasionally have "15 to 20 seconds" of pain with initiation of intercourse but this quickly resolves and she is able to continue without issue.    Hx: The following portions of the patient's history were reviewed and updated as appropriate:             She  has a past medical history of Ovarian cyst and PTSD (post-traumatic stress disorder). She does not have any pertinent problems on file. She  has a past surgical history that includes no history and Dilation and evacuation (N/A, 03/21/2019). Her family history includes Cervical cancer in her mother; Gallstones in her paternal grandmother; Hypertension in her father. She  reports that she has never smoked. She has never used smokeless tobacco. She reports previous alcohol use. She reports previous drug use. Drug: Marijuana. She has a current medication list which includes the following prescription(s): acetaminophen and levonorgestrel. She has No Known Allergies.       Review of Systems:  Review of Systems  Constitutional: Denied constitutional symptoms, night sweats, recent illness, fatigue, fever, insomnia and weight loss.  Eyes: Denied eye symptoms, eye pain, photophobia, vision change and visual disturbance.  Ears/Nose/Throat/Neck: Denied ear, nose, throat or neck symptoms, hearing loss, nasal discharge, sinus congestion and sore throat.  Cardiovascular: Denied cardiovascular symptoms, arrhythmia, chest pain/pressure, edema, exercise intolerance, orthopnea and palpitations.  Respiratory: Denied pulmonary symptoms, asthma, pleuritic pain, productive sputum, cough,  dyspnea and wheezing.  Gastrointestinal: Denied, gastro-esophageal reflux, melena, nausea and vomiting.  Genitourinary: Denied genitourinary symptoms including symptomatic vaginal discharge, pelvic relaxation issues, and urinary complaints.  Musculoskeletal: Denied musculoskeletal symptoms, stiffness, swelling, muscle weakness and myalgia.  Dermatologic: Denied dermatology symptoms, rash and scar.  Neurologic: Denied neurology symptoms, dizziness, headache, neck pain and syncope.  Psychiatric: Denied psychiatric symptoms, anxiety and depression.  Endocrine: Denied endocrine symptoms including hot flashes and night sweats.   Meds:   Current Outpatient Medications on File Prior to Visit  Medication Sig Dispense Refill  . acetaminophen (TYLENOL) 500 MG tablet Take 1,000 mg by mouth every 6 (six) hours as needed (for pain.).    Marland Kitchen levonorgestrel (MIRENA) 20 MCG/24HR IUD 1 each by Intrauterine route once.     No current facility-administered medications on file prior to visit.       Upstream - 06/10/20 0744      Pregnancy Intention Screening   Does the patient want to become pregnant in the next year? No    Does the patient's partner want to become pregnant in the next year? No    Would the patient like to discuss contraceptive options today? No      Contraception Wrap Up   Current Method IUD or IUS    End Method IUD or IUS    Contraception Counseling Provided No          The pregnancy intention screening data noted above was reviewed. Potential methods of contraception were discussed. The patient elected to proceed with Pregnant/Seeking Pregnancy.     Objective:     Vitals:   06/10/20 0733  BP: 129/85  Pulse: 73  Filed Weights   06/10/20 0733  Weight: 202 lb 3.2 oz (91.7 kg)              Physical examination General NAD, Conversant  HEENT Atraumatic; Op clear with mmm.  Normo-cephalic. Pupils reactive. Anicteric sclerae  Thyroid/Neck Smooth without nodularity or  enlargement. Normal ROM.  Neck Supple.  Skin No rashes, lesions or ulceration. Normal palpated skin turgor. No nodularity.  Breasts: No masses or discharge.  Symmetric.  No axillary adenopathy.  Lungs: Clear to auscultation.No rales or wheezes. Normal Respiratory effort, no retractions.  Heart: NSR.  No murmurs or rubs appreciated. No periferal edema  Abdomen: Soft.  Non-tender.  No masses.  No HSM. No hernia  Extremities: Moves all appropriately.  Normal ROM for age. No lymphadenopathy.  Neuro: Oriented to PPT.  Normal mood. Normal affect.     Pelvic:   Vulva: Normal appearance.  No lesions.  Vagina: No lesions or abnormalities noted.  Support: Normal pelvic support.  Urethra No masses tenderness or scarring.  Meatus Normal size without lesions or prolapse.  Cervix: Normal appearance.  No lesions.  IUD strings noted  Anus: Normal exam.  No lesions.  Perineum: Normal exam.  No lesions.        Bimanual   Uterus: Normal size.  Non-tender.  Mobile.  AV.  Adnexae: No masses.  Non-tender to palpation.  Cul-de-sac: Negative for abnormality.   IUD Removal Strings of IUD identified and grasped.  IUD removed without problem.  Pt tolerated this well.  IUD noted to be intact.      Assessment:    G2P0020 Patient Active Problem List   Diagnosis Date Noted  . History of miscarriage, currently pregnant 02/12/2019     1. Well woman exam with routine gynecological exam   2. Encounter for preconception consultation     Patient desires immediate fertility.  Has requested IUD removal.  Vulvar issues have almost completely resolved.   Plan:            1.  Basic Screening Recommendations The basic screening recommendations for asymptomatic women were discussed with the patient during her visit.  The age-appropriate recommendations were discussed with her and the rational for the tests reviewed.  When I am informed by the patient that another primary care physician has previously obtained  the age-appropriate tests and they are up-to-date, only outstanding tests are ordered and referrals given as necessary.  Abnormal results of tests will be discussed with her when all of her results are completed.  Routine preventative health maintenance measures emphasized: Exercise/Diet/Weight control, Tobacco Warnings, Alcohol/Substance use risks and Stress Management Pap performed -lab work ordered 2.  Preconceptual counseling given.  Timing of intercourse, use of prenatal vitamins, cycle length, early prenatal care discussed.  Orders Orders Placed This Encounter  Procedures  . Hemoglobin A1c  . Lipid panel  . TSH    No orders of the defined types were placed in this encounter.           F/U  No follow-ups on file. I spent 12 minutes involved in the care of this patient preparing to see the patient by obtaining and reviewing her medical history (including labs, imaging tests and prior procedures),documenting clinical information in the electronic health record (EHR), counseling and coordinating care plans, writing and sending prescriptions, ordering tests or procedures and directly communicating with the patient by discussing pertinent items from her history and physical exam as well as detailing my assessment and plan as noted above  so that she has an informed understanding.  All of her questions were answered.  This additional and extra time not usually associated with annual examination was spent on preconceptual counseling, discussion regarding removal of IUD, cycle length and timing of intercourse.  Elonda Husky, M.D. 06/10/2020 8:08 AM

## 2020-06-11 LAB — LIPID PANEL
Chol/HDL Ratio: 3.7 ratio (ref 0.0–4.4)
Cholesterol, Total: 179 mg/dL (ref 100–199)
HDL: 48 mg/dL (ref >39)
LDL Chol Calc (NIH): 114 mg/dL — ABNORMAL HIGH (ref 0–99)
Triglycerides: 90 mg/dL (ref 0–149)
VLDL Cholesterol Cal: 17 mg/dL (ref 5–40)

## 2020-06-11 LAB — TSH: TSH: 4.04 u[IU]/mL (ref 0.450–4.500)

## 2020-06-11 LAB — HEMOGLOBIN A1C
Est. average glucose Bld gHb Est-mCnc: 111 mg/dL
Hgb A1c MFr Bld: 5.5 % (ref 4.8–5.6)

## 2020-06-14 LAB — CYTOLOGY - PAP
Chlamydia: NEGATIVE
Comment: NEGATIVE
Comment: NORMAL
Diagnosis: NEGATIVE
Neisseria Gonorrhea: NEGATIVE

## 2020-06-16 DIAGNOSIS — F4325 Adjustment disorder with mixed disturbance of emotions and conduct: Secondary | ICD-10-CM | POA: Diagnosis not present

## 2020-07-03 DIAGNOSIS — F4325 Adjustment disorder with mixed disturbance of emotions and conduct: Secondary | ICD-10-CM | POA: Diagnosis not present

## 2020-07-06 DIAGNOSIS — F4325 Adjustment disorder with mixed disturbance of emotions and conduct: Secondary | ICD-10-CM | POA: Diagnosis not present

## 2020-07-15 DIAGNOSIS — F4325 Adjustment disorder with mixed disturbance of emotions and conduct: Secondary | ICD-10-CM | POA: Diagnosis not present

## 2020-07-18 ENCOUNTER — Other Ambulatory Visit: Payer: Self-pay

## 2020-07-18 ENCOUNTER — Emergency Department (HOSPITAL_COMMUNITY)
Admission: EM | Admit: 2020-07-18 | Discharge: 2020-07-18 | Disposition: A | Discharge status: Home / Self Care | Payer: BC Managed Care – PPO | Attending: Emergency Medicine | Admitting: Emergency Medicine

## 2020-07-18 ENCOUNTER — Emergency Department (HOSPITAL_COMMUNITY): Payer: BC Managed Care – PPO

## 2020-07-18 DIAGNOSIS — M25562 Pain in left knee: Secondary | ICD-10-CM

## 2020-07-18 DIAGNOSIS — M25462 Effusion, left knee: Secondary | ICD-10-CM

## 2020-07-18 DIAGNOSIS — Y9344 Activity, trampolining: Secondary | ICD-10-CM | POA: Diagnosis not present

## 2020-07-18 LAB — POC URINE PREG, ED: Preg Test, Ur: NEGATIVE

## 2020-07-18 MED ORDER — ACETAMINOPHEN 325 MG PO TABS
650.0000 mg | ORAL_TABLET | Freq: Once | ORAL | Status: AC
  Administered 2020-07-18: 650 mg via ORAL
  Filled 2020-07-18: qty 2

## 2020-07-18 NOTE — ED Provider Notes (Signed)
[facility]-EMERGENCY DEPT Provider Note   CSN: [id number] Arrival date & time: 07/18/20  0539     History Chief Complaint  Patient presents with   Knee Injury    Shadasia Oldfield is a 24 y.o. female history of obesity, ovarian cyst, PTSD.  Patient arrives for left knee pain onset around 4 PM yesterday she was jumping at a trampoline park when she landed wrong on her left knee she heard a pop felt immediate pain to her lateral joint line it is been constant since onset moderate intensity aching throbbing worsened with ambulation improved with rest and Tylenol.  She feels some minimal swelling to the area has not noted any color change.  Denies head injury, loss of consciousness, blood thinner use, neck pain, back pain, chest pain, abdominal pain, upper extremity pain, right lower extremity pain, numbness/weakness, tingling or any additional concerns  HPI     Past Medical History:  Diagnosis Date   Ovarian cyst    PTSD (post-traumatic stress disorder)     Patient Active Problem List   Diagnosis Date Noted   History of miscarriage, currently pregnant 02/12/2019    Past Surgical History:  Procedure Laterality Date   DILATION AND EVACUATION N/A 03/21/2019   Procedure: DILATATION AND EVACUATION;  Surgeon: Linzie Collin, MD;  Location: [facility] ORS;  Service: Gynecology;  Laterality: N/A;   no history       OB History    Gravida  2   Para      Term      Preterm      AB  2   Living        SAB  2   IAB      Ectopic      Multiple      Live Births              Family History  Problem Relation Age of Onset   Cervical cancer Mother    Hypertension Father    Gallstones Paternal Grandmother     Social History   Tobacco Use   Smoking status: Never Smoker   Smokeless tobacco: Never Used  Vaping Use   Vaping Use: Never used  Substance Use Topics   Alcohol use: Not Currently   Drug use: Not Currently     Types: Marijuana    Home Medications Prior to Admission medications   Medication Sig Start Date End Date Taking? Authorizing Provider  acetaminophen (TYLENOL) 500 MG tablet Take 1,000 mg by mouth every 6 (six) hours as needed (for pain.).    [provider]  levonorgestrel (MIRENA) 20 MCG/24HR IUD 1 each by Intrauterine route once.    [provider]    Allergies    Patient has no known allergies.  Review of Systems   Review of Systems  Constitutional: Negative.  Negative for chills and fever.  Cardiovascular: Negative.  Negative for chest pain.  Gastrointestinal: Negative.  Negative for abdominal pain.  Musculoskeletal: Positive for arthralgias (Left knee) and joint swelling. Negative for back pain and neck pain.  Skin: Negative.  Negative for color change.  Neurological: Negative.  Negative for headaches.    Physical Exam Updated Vital Signs BP (!) 145/104    Pulse 74    Temp 98.3 F (36.8 C) (Oral)    Resp 16    Ht 5\' 4"  (1.626 m)    Wt 90.7 kg    LMP 07/12/2020    SpO2 99%    BMI  34.33 kg/m   Physical Exam Constitutional:      General: She is not in acute distress.    Appearance: Normal appearance. She is well-developed. She is obese. She is not ill-appearing or diaphoretic.  HENT:     Head: Normocephalic and atraumatic.  Eyes:     General: Vision grossly intact. Gaze aligned appropriately.     Pupils: Pupils are equal, round, and reactive to light.  Neck:     Trachea: Trachea and phonation normal.  Pulmonary:     Effort: Pulmonary effort is normal. No respiratory distress.  Abdominal:     General: There is no distension.     Palpations: Abdomen is soft.     Tenderness: There is no abdominal tenderness. There is no guarding or rebound.  Musculoskeletal:        General: Normal range of motion.     Cervical back: Normal range of motion.     Comments: Left Knee: Appearance normal. No obvious deformity. No skin swelling, erythema, heat, fluctuance or  break of the skin.  Tenderness to palpation over Lateral joint line.  Flexion and extension limited due to pain. Negative anterior/poster drawer bilaterally.Negative ballottement test. No varus or valgus laxity or locking. No tenderness to palpation of hips or ankles.Compartments soft. Neurovascularly intact distally to site of injury.  Skin:    General: Skin is warm and dry.  Neurological:     Mental Status: She is alert.     GCS: GCS eye subscore is 4. GCS verbal subscore is 5. GCS motor subscore is 6.     Comments: Speech is clear and goal oriented, follows commands Major Cranial nerves without deficit, no facial droop Moves extremities without ataxia, coordination intact  Psychiatric:        Behavior: Behavior normal.     ED Results / Procedures / Treatments   Labs (all labs ordered are listed, but only abnormal results are displayed) Labs Reviewed  POC URINE PREG, ED    EKG None  Radiology DG Knee Complete 4 Views Left  Result Date: 07/18/2020 CLINICAL DATA:  Fall yesterday on trampoline. Knee pain and inability to bear weight. Initial encounter. EXAM: LEFT KNEE - COMPLETE 4+ VIEW COMPARISON:  None. FINDINGS: No evidence of fracture or dislocation. A moderate knee joint effusion is seen. No evidence of arthropathy or other focal bone abnormality. Soft tissues are unremarkable. IMPRESSION: Moderate knee joint effusion. No evidence of fracture or dislocation. Electronically Signed   By: Danae Orleans M.D.   On: 07/18/2020 11:22    Procedures Procedures (including critical care time)  Medications Ordered in ED Medications  acetaminophen (TYLENOL) tablet 650 mg (650 mg Oral Given 07/18/20 1130)    ED Course  I have reviewed the triage vital signs and the nursing notes.  Pertinent labs & imaging results that were available during my care of the patient were reviewed by me and considered in my medical decision making (see chart for details).    MDM Rules/Calculators/A&P                          Additional history obtained from: 1. Nursing notes from this visit. -  24 year old female presents 1 day after she injured her left knee when jumping on a trampoline.  She has some lateral left knee joint line tenderness without significant swelling.  No skin break.  Strong equal pedal pulses good capillary fill and sensation to all toes.  Movement at the hip  and ankle intact.  Some pain with range of motion at the knee.  No evidence for septic arthritis, cellulitis, DVT, compartment syndrome, neurovascular compromise or other emergent pathologies.  Suspect likely ligamentous, tendon or meniscal injury.  Lower suspicion for fracture/dislocation but will obtain x-ray to confirm.  Plan of care is knee immobilizer, crutches and orthopedic follow-up.  No indication for imaging of the head neck back chest abdomen or other major joints. - DG Left Knee:  IMPRESSION:  Moderate knee joint effusion. No evidence of fracture or  dislocation.   Patient updated on x-ray findings above and states understanding. Patient aware of potential for ligamentous, meniscal, tenderness or other soft tissue injuries and need for orthopedist follow-up. She is aware that unseen fractures may be present and repeat imaging may be needed if symptoms do not improve. She was referred to on-call orthopedist Dr. Dion Saucier and encouraged to call their office today to schedule follow-up appointment. Patient given knee immobilizer and crutches advised nonweightbearing until cleared by orthopedist. Work note given.  At this time there does not appear to be any evidence of an acute emergency medical condition and the patient appears stable for discharge with appropriate outpatient follow up. Diagnosis was discussed with patient who verbalizes understanding of care plan and is agreeable to discharge. I have discussed return precautions with patient who verbalizes understanding. Patient encouraged to follow-up with their PCP  and Ortho. All questions answered.  Note: Portions of this report may have been transcribed using voice recognition software. Every effort was made to ensure accuracy; however, inadvertent computerized transcription errors may still be present. Final Clinical Impression(s) / ED Diagnoses Final diagnoses:  Effusion of left knee  Acute pain of left knee    Rx / DC Orders ED Discharge Orders    None       Elizabeth Palau 07/18/20 1139    Sabino Donovan, MD 07/18/20 1423

## 2020-07-18 NOTE — ED Triage Notes (Signed)
Was at trampoline park yesterday and landed wrong injuring left knee. Patient unable to bear weight or ambulate with affected extremity. Pain 7/10. No relief with Tylenol.

## 2020-07-18 NOTE — Progress Notes (Signed)
Orthopedic Tech Progress Note Patient Details:  Sarah Nichols 09-24-1995 [id number]  Ortho Devices Ortho Device/Splint Location: applied knee immobilizer to LLE and crutches Ortho Device/Splint Interventions: Ordered,Application,Adjustment   Post Interventions Patient Tolerated: Well Instructions Provided: Care of device   [name] 07/18/2020, 11:52 AM

## 2020-07-18 NOTE — Discharge Instructions (Signed)
At this time there does not appear to be the presence of an emergent medical condition, however there is always the potential for conditions to change. Please read and follow the below instructions.  Please return to the Emergency Department immediately for any new or worsening symptoms. Please be sure to follow up with your Primary Care Provider within one week regarding your visit today; please call their office to schedule an appointment even if you are feeling better for a follow-up visit. Please call the orthopedic specialist Dr. Dion Saucier on your discharge paperwork today to schedule a follow-up appointment for reevaluation of your left knee. Please use the knee immobilizer and crutches to keep weight off of your knee. Please do not attempt to walk on your leg or bear weight until cleared by the orthopedic specialist. You may need follow-up imaging of your knee if symptoms do not improve.  Go to the nearest Emergency Department immediately if: You have fever or chills Your knee swells, and the swelling gets worse. You cannot move your knee. You have very bad knee pain. You have any new/concerning or worsening of symptoms   Please read the additional information packets attached to your discharge summary.  Do not take your medicine if  develop an itchy rash, swelling in your mouth or lips, or difficulty breathing; call 911 and seek immediate emergency medical attention if this occurs.  You may review your lab tests and imaging results in their entirety on your MyChart account.  Please discuss all results of fully with your primary care provider and other specialist at your follow-up visit.  Note: Portions of this text may have been transcribed using voice recognition software. Every effort was made to ensure accuracy; however, inadvertent computerized transcription errors may still be present.

## 2020-07-20 DIAGNOSIS — F4325 Adjustment disorder with mixed disturbance of emotions and conduct: Secondary | ICD-10-CM | POA: Diagnosis not present

## 2020-07-28 DIAGNOSIS — M25562 Pain in left knee: Secondary | ICD-10-CM | POA: Diagnosis not present

## 2020-07-31 NOTE — L&D Delivery Note (Signed)
OB/GYN Faculty Practice Delivery Note  Sarah Nichols is a 25 y.o. G3P0020 s/p SVD at [redacted]w[redacted]d. She was admitted for SROM/SOL.   ROM: 16h 45m with clear fluid>moderate meconium at delivery  GBS Status: Negative/-- (10/25 0000) Maximum Maternal Temperature: 28F  Labor Progress: Initial SVE: 3/80/-2. She then progressed to complete with expectant management.   Delivery Date/Time: 11/18, 2049 Delivery: Called to room and patient was complete and pushing. Head delivered LOA. Loose nuchal cord present x1. Shoulder and body delivered in usual fashion. Infant placed on mother's abdomen, dried and stimulated. Cord clamped x 2 after 1-minute delay, and cut by FOB. Cord blood drawn. Placenta delivered spontaneously with gentle cord traction. Brisk uterine bleeding noted shortly after placenta delivery. Fundus boggy with massage and Pitocin. Given methergine (BP WNL prior to administration) and TXA. [name] brought to bedside, however upon recheck with manual uterine sweep/fundal massage her uterine tone greatly improved. Labia, perineum, vagina, and cervix inspected with 1st degree perineal and bilateral shallow labial lacerations. Her perineal and right labial tear were repaired with 3.0 vicryl. Left labial was hemostatic and not repaired. Urinary catheter replaced with about 200cc clear urine. A lower uterine segment sweep was additionally performed after repair without any retrieval of clots or increased bleeding. Will continue methergine series postpartum. PPH labs collected, maternal VVS.  Baby Weight: pending  Placenta: 3 vessel, intact. Sent to L&D. Complications: PPH  Lacerations: 1st degree perineal, bilateral labial  EBL: 1457 mL Analgesia: Epidural   Infant:  APGAR (1 MIN): 5   APGAR (5 MINS): 7 APGAR (10 MINS): 9     Infant required suctioning and oxygen briefly with meconium fluid retrieved, however left to skin to skin afterwards.   [name], DO  OB Family Medicine Fellow,  [facility] for [facility], [facility] 06/17/2021, 9:36 PM

## 2020-08-03 DIAGNOSIS — M25562 Pain in left knee: Secondary | ICD-10-CM | POA: Diagnosis not present

## 2020-08-06 DIAGNOSIS — S83512A Sprain of anterior cruciate ligament of left knee, initial encounter: Secondary | ICD-10-CM | POA: Diagnosis not present

## 2020-08-06 DIAGNOSIS — F4325 Adjustment disorder with mixed disturbance of emotions and conduct: Secondary | ICD-10-CM | POA: Diagnosis not present

## 2020-08-09 DIAGNOSIS — F4325 Adjustment disorder with mixed disturbance of emotions and conduct: Secondary | ICD-10-CM | POA: Diagnosis not present

## 2020-08-13 DIAGNOSIS — F4325 Adjustment disorder with mixed disturbance of emotions and conduct: Secondary | ICD-10-CM | POA: Diagnosis not present

## 2020-08-17 DIAGNOSIS — F4325 Adjustment disorder with mixed disturbance of emotions and conduct: Secondary | ICD-10-CM | POA: Diagnosis not present

## 2020-08-20 DIAGNOSIS — F4325 Adjustment disorder with mixed disturbance of emotions and conduct: Secondary | ICD-10-CM | POA: Diagnosis not present

## 2020-08-25 DIAGNOSIS — F4325 Adjustment disorder with mixed disturbance of emotions and conduct: Secondary | ICD-10-CM | POA: Diagnosis not present

## 2020-08-31 DIAGNOSIS — F4325 Adjustment disorder with mixed disturbance of emotions and conduct: Secondary | ICD-10-CM | POA: Diagnosis not present

## 2020-09-02 DIAGNOSIS — G8918 Other acute postprocedural pain: Secondary | ICD-10-CM | POA: Diagnosis not present

## 2020-09-02 DIAGNOSIS — S83512A Sprain of anterior cruciate ligament of left knee, initial encounter: Secondary | ICD-10-CM | POA: Diagnosis not present

## 2020-09-02 DIAGNOSIS — F4325 Adjustment disorder with mixed disturbance of emotions and conduct: Secondary | ICD-10-CM | POA: Diagnosis not present

## 2020-09-02 DIAGNOSIS — M23352 Other meniscus derangements, posterior horn of lateral meniscus, left knee: Secondary | ICD-10-CM | POA: Diagnosis not present

## 2020-09-08 DIAGNOSIS — F4325 Adjustment disorder with mixed disturbance of emotions and conduct: Secondary | ICD-10-CM | POA: Diagnosis not present

## 2020-09-15 DIAGNOSIS — S83512D Sprain of anterior cruciate ligament of left knee, subsequent encounter: Secondary | ICD-10-CM | POA: Diagnosis not present

## 2020-09-16 DIAGNOSIS — F4325 Adjustment disorder with mixed disturbance of emotions and conduct: Secondary | ICD-10-CM | POA: Diagnosis not present

## 2020-09-20 DIAGNOSIS — F4325 Adjustment disorder with mixed disturbance of emotions and conduct: Secondary | ICD-10-CM | POA: Diagnosis not present

## 2020-10-06 DIAGNOSIS — F4325 Adjustment disorder with mixed disturbance of emotions and conduct: Secondary | ICD-10-CM | POA: Diagnosis not present

## 2020-10-08 DIAGNOSIS — Z3200 Encounter for pregnancy test, result unknown: Secondary | ICD-10-CM | POA: Diagnosis not present

## 2020-10-12 ENCOUNTER — Encounter: Payer: BC Managed Care – PPO | Admitting: Obstetrics and Gynecology

## 2020-10-13 DIAGNOSIS — F4325 Adjustment disorder with mixed disturbance of emotions and conduct: Secondary | ICD-10-CM | POA: Diagnosis not present

## 2020-10-20 DIAGNOSIS — F4325 Adjustment disorder with mixed disturbance of emotions and conduct: Secondary | ICD-10-CM | POA: Diagnosis not present

## 2020-10-25 DIAGNOSIS — F4325 Adjustment disorder with mixed disturbance of emotions and conduct: Secondary | ICD-10-CM | POA: Diagnosis not present

## 2020-10-25 DIAGNOSIS — S83512D Sprain of anterior cruciate ligament of left knee, subsequent encounter: Secondary | ICD-10-CM | POA: Diagnosis not present

## 2020-11-01 DIAGNOSIS — M25662 Stiffness of left knee, not elsewhere classified: Secondary | ICD-10-CM | POA: Diagnosis not present

## 2020-11-01 DIAGNOSIS — R262 Difficulty in walking, not elsewhere classified: Secondary | ICD-10-CM | POA: Diagnosis not present

## 2020-11-01 DIAGNOSIS — M25562 Pain in left knee: Secondary | ICD-10-CM | POA: Diagnosis not present

## 2020-11-01 DIAGNOSIS — M6281 Muscle weakness (generalized): Secondary | ICD-10-CM | POA: Diagnosis not present

## 2020-11-02 ENCOUNTER — Ambulatory Visit (INDEPENDENT_AMBULATORY_CARE_PROVIDER_SITE_OTHER): Payer: BC Managed Care – PPO | Admitting: Obstetrics and Gynecology

## 2020-11-02 ENCOUNTER — Encounter: Payer: Self-pay | Admitting: Obstetrics and Gynecology

## 2020-11-02 ENCOUNTER — Other Ambulatory Visit: Payer: Self-pay

## 2020-11-02 VITALS — BP 133/84 | HR 93 | Ht 64.0 in | Wt 201.4 lb

## 2020-11-02 DIAGNOSIS — F419 Anxiety disorder, unspecified: Secondary | ICD-10-CM

## 2020-11-02 DIAGNOSIS — F32A Depression, unspecified: Secondary | ICD-10-CM | POA: Diagnosis not present

## 2020-11-02 DIAGNOSIS — Z3200 Encounter for pregnancy test, result unknown: Secondary | ICD-10-CM | POA: Diagnosis not present

## 2020-11-02 MED ORDER — SERTRALINE HCL 50 MG PO TABS: 50.0000 mg | ORAL_TABLET | Freq: Every day | ORAL | 1 refills | Status: DC

## 2020-11-02 NOTE — Progress Notes (Signed)
HPI:      Ms. Ressie Slevin is a 25 y.o. G3P0020 who LMP was Patient's last menstrual period was 09/12/2020.  Subjective:   She presents today because she has missed a period.  She was attempting pregnancy. (Had IUD removed 2021).  She has had some pelvic pressure and cramping but denies bleeding. (History of first trimester loss) and she says that this pregnancy feels different.  She has had some nausea and vomiting but is generally keeping food down.  Using Tums for heartburn. She does state that she has significant anxiety/depression.  She wakes up at night crying.  She is seeing a Veterinary surgeon for this.  She is requesting medication and her counselor says this is probably a good idea. Currently taking prenatal vitamins.    Hx: The following portions of the patient's history were reviewed and updated as appropriate:             She  has a past medical history of Ovarian cyst and PTSD (post-traumatic stress disorder). She does not have any pertinent problems on file. She  has a past surgical history that includes no history and Dilation and evacuation (N/A, 03/21/2019). Her family history includes Cervical cancer in her mother; Gallstones in her paternal grandmother; Hypertension in her father. She  reports that she has never smoked. She has never used smokeless tobacco. She reports previous alcohol use. She reports previous drug use. Drug: Marijuana. She has a current medication list which includes the following prescription(s): acetaminophen. She has No Known Allergies.       Review of Systems:  Review of Systems  Constitutional: Denied constitutional symptoms, night sweats, recent illness, fatigue, fever, insomnia and weight loss.  Eyes: Denied eye symptoms, eye pain, photophobia, vision change and visual disturbance.  Ears/Nose/Throat/Neck: Denied ear, nose, throat or neck symptoms, hearing loss, nasal discharge, sinus congestion and sore throat.  Cardiovascular: Denied  cardiovascular symptoms, arrhythmia, chest pain/pressure, edema, exercise intolerance, orthopnea and palpitations.  Respiratory: Denied pulmonary symptoms, asthma, pleuritic pain, productive sputum, cough, dyspnea and wheezing.  Gastrointestinal: Denied, gastro-esophageal reflux, melena, nausea and vomiting.  Genitourinary: Denied genitourinary symptoms including symptomatic vaginal discharge, pelvic relaxation issues, and urinary complaints.  Musculoskeletal: Denied musculoskeletal symptoms, stiffness, swelling, muscle weakness and myalgia.  Dermatologic: Denied dermatology symptoms, rash and scar.  Neurologic: Denied neurology symptoms, dizziness, headache, neck pain and syncope.  Psychiatric: Denied psychiatric symptoms, anxiety and depression.  Endocrine: Denied endocrine symptoms including hot flashes and night sweats.   Meds:   Current Outpatient Medications on File Prior to Visit  Medication Sig Dispense Refill  . acetaminophen (TYLENOL) 500 MG tablet Take 1,000 mg by mouth every 6 (six) hours as needed (for pain.).     No current facility-administered medications on file prior to visit.          Objective:     Vitals:   11/02/20 1044  BP: 133/84  Pulse: 93   Filed Weights   11/02/20 1044  Weight: 201 lb 6.4 oz (91.4 kg)                Assessment:    [id number] Patient Active Problem List   Diagnosis Date Noted  . History of miscarriage, currently pregnant 02/12/2019     1. Possible pregnancy, not confirmed     Patient with a history of miscarriage-currently pregnant-proximately 7 weeks estimated gestational age by LMP.   Plan:            Prenatal Plan 1.  The patient was  given prenatal literature. 2.  She was continued on prenatal vitamins. 3.  A prenatal lab panel to be drawn at nurse visit. 4.  An ultrasound was ordered to better determine an EDC. 5.  A nurse visit was scheduled. 6.  Genetic testing and testing for other inheritable conditions  discussed in detail. She will decide in the future whether to have these labs performed. 7.  A general overview of pregnancy testing, visit schedule, ultrasound schedule, and prenatal care was discussed. 8.  COVID and its risks associated with pregnancy, prevention by limiting exposure and use of masks, as well as the risks and benefits of vaccination during pregnancy were discussed in detail.  [facility] policy regarding office and hospital visitation and testing was explained. 9.  Benefits of breast-feeding discussed in detail including both maternal and infant benefits. Ready Set Baby website discussed. 10.  Nausea vomiting of pregnancy discussed.  Literature given. 11.  Begin Zoloft 50 mg daily.  Discussed this in detail with the patient.  She will continue counseling as well.   Orders Orders Placed This Encounter  Procedures  . US OB Comp Less 14 Wks    No orders of the defined types were placed in this encounter.     F/U  Return in about 6 weeks (around 12/14/2020). I spent 31 minutes involved in the care of this patient preparing to see the patient by obtaining and reviewing her medical history (including labs, imaging tests and prior procedures), documenting clinical information in the electronic health record (EHR), counseling and coordinating care plans, writing and sending prescriptions, ordering tests or procedures and directly communicating with the patient by discussing pertinent items from her history and physical exam as well as detailing my assessment and plan as noted above so that she has an informed understanding.  All of her questions were answered.  Elonda Husky, M.D. 11/02/2020 11:12 AM

## 2020-11-03 DIAGNOSIS — F4325 Adjustment disorder with mixed disturbance of emotions and conduct: Secondary | ICD-10-CM | POA: Diagnosis not present

## 2020-11-05 DIAGNOSIS — M6281 Muscle weakness (generalized): Secondary | ICD-10-CM | POA: Diagnosis not present

## 2020-11-05 DIAGNOSIS — M25562 Pain in left knee: Secondary | ICD-10-CM | POA: Diagnosis not present

## 2020-11-05 DIAGNOSIS — R262 Difficulty in walking, not elsewhere classified: Secondary | ICD-10-CM | POA: Diagnosis not present

## 2020-11-05 DIAGNOSIS — M25662 Stiffness of left knee, not elsewhere classified: Secondary | ICD-10-CM | POA: Diagnosis not present

## 2020-11-09 ENCOUNTER — Encounter (HOSPITAL_COMMUNITY): Payer: Self-pay | Admitting: Obstetrics and Gynecology

## 2020-11-09 ENCOUNTER — Inpatient Hospital Stay (HOSPITAL_COMMUNITY)
Admission: AD | Admit: 2020-11-09 | Discharge: 2020-11-09 | Disposition: A | Discharge status: Home / Self Care | Payer: BC Managed Care – PPO | Attending: Obstetrics and Gynecology | Admitting: Obstetrics and Gynecology

## 2020-11-09 ENCOUNTER — Inpatient Hospital Stay (HOSPITAL_COMMUNITY): Payer: BC Managed Care – PPO

## 2020-11-09 ENCOUNTER — Other Ambulatory Visit: Payer: Self-pay

## 2020-11-09 DIAGNOSIS — O468X1 Other antepartum hemorrhage, first trimester: Secondary | ICD-10-CM | POA: Diagnosis not present

## 2020-11-09 DIAGNOSIS — O26891 Other specified pregnancy related conditions, first trimester: Secondary | ICD-10-CM | POA: Insufficient documentation

## 2020-11-09 DIAGNOSIS — Z6791 Unspecified blood type, Rh negative: Secondary | ICD-10-CM | POA: Diagnosis not present

## 2020-11-09 DIAGNOSIS — O4690 Antepartum hemorrhage, unspecified, unspecified trimester: Secondary | ICD-10-CM

## 2020-11-09 DIAGNOSIS — O208 Other hemorrhage in early pregnancy: Secondary | ICD-10-CM | POA: Diagnosis not present

## 2020-11-09 DIAGNOSIS — Z3490 Encounter for supervision of normal pregnancy, unspecified, unspecified trimester: Secondary | ICD-10-CM

## 2020-11-09 DIAGNOSIS — Z3A08 8 weeks gestation of pregnancy: Secondary | ICD-10-CM | POA: Diagnosis not present

## 2020-11-09 DIAGNOSIS — O418X11 Other specified disorders of amniotic fluid and membranes, first trimester, fetus 1: Secondary | ICD-10-CM

## 2020-11-09 DIAGNOSIS — O26899 Other specified pregnancy related conditions, unspecified trimester: Secondary | ICD-10-CM

## 2020-11-09 DIAGNOSIS — O360110 Maternal care for anti-D [Rh] antibodies, first trimester, not applicable or unspecified: Secondary | ICD-10-CM | POA: Diagnosis not present

## 2020-11-09 LAB — CBC
HCT: 41 % (ref 36.0–46.0)
Hemoglobin: 13.9 g/dL (ref 12.0–15.0)
MCH: 28.1 pg (ref 26.0–34.0)
MCHC: 33.9 g/dL (ref 30.0–36.0)
MCV: 83 fL (ref 80.0–100.0)
Platelets: 276 10*3/uL (ref 150–400)
RBC: 4.94 MIL/uL (ref 3.87–5.11)
RDW: 11.9 % (ref 11.5–15.5)
WBC: 13.1 10*3/uL — ABNORMAL HIGH (ref 4.0–10.5)
nRBC: 0 % (ref 0.0–0.2)

## 2020-11-09 LAB — URINALYSIS, ROUTINE W REFLEX MICROSCOPIC
Bilirubin Urine: NEGATIVE
Glucose, UA: NEGATIVE mg/dL
Ketones, ur: NEGATIVE mg/dL
Leukocytes,Ua: NEGATIVE
Nitrite: NEGATIVE
Protein, ur: NEGATIVE mg/dL
RBC / HPF: >50 RBC/hpf — ABNORMAL HIGH (ref 0–5)
Specific Gravity, Urine: 1.028 (ref 1.005–1.030)
pH: 5 (ref 5.0–8.0)

## 2020-11-09 LAB — COMPREHENSIVE METABOLIC PANEL
ALT: 14 U/L (ref 0–44)
AST: 18 U/L (ref 15–41)
Albumin: 3.7 g/dL (ref 3.5–5.0)
Alkaline Phosphatase: 61 U/L (ref 38–126)
Anion gap: 8 (ref 5–15)
BUN: 16 mg/dL (ref 6–20)
CO2: 24 mmol/L (ref 22–32)
Calcium: 9.3 mg/dL (ref 8.9–10.3)
Chloride: 101 mmol/L (ref 98–111)
Creatinine, Ser: 0.55 mg/dL (ref 0.44–1.00)
GFR, Estimated: >60 mL/min (ref >60)
Glucose, Bld: 102 mg/dL — ABNORMAL HIGH (ref 70–99)
Potassium: 3.9 mmol/L (ref 3.5–5.1)
Sodium: 133 mmol/L — ABNORMAL LOW (ref 135–145)
Total Bilirubin: 0.5 mg/dL (ref 0.3–1.2)
Total Protein: 7.2 g/dL (ref 6.5–8.1)

## 2020-11-09 LAB — WET PREP, GENITAL
Clue Cells Wet Prep HPF POC: NONE SEEN
Sperm: NONE SEEN
Trich, Wet Prep: NONE SEEN
Yeast Wet Prep HPF POC: NONE SEEN

## 2020-11-09 LAB — HCG, QUANTITATIVE, PREGNANCY: hCG, Beta Chain, Quant, S: 258729 m[IU]/mL — ABNORMAL HIGH (ref <5)

## 2020-11-09 MED ORDER — RHO D IMMUNE GLOBULIN 1500 UNIT/2ML IJ SOSY
300.0000 ug | PREFILLED_SYRINGE | Freq: Once | INTRAMUSCULAR | Status: AC
  Administered 2020-11-09: 300 ug via INTRAMUSCULAR
  Filled 2020-11-09: qty 2

## 2020-11-09 NOTE — MAU Provider Note (Signed)
History     CSN: [id number]  Arrival date and time: 11/09/20 1743   Event Date/Time   First Provider Initiated Contact with Patient 11/09/20 1853      Chief Complaint  Patient presents with  . Vaginal Bleeding   Sarah Nichols is a 25 y.o. G3P0020 at [redacted]w[redacted]d who presents to MAU for vaginal bleeding which began earlier today. Patient reports intercourse today and several hours later experienced a gush of blood, followed by a small amount of bleeding on her pad. Patient endorses minimal cramping at this time, and states that the bleeding continues to become less. Patient reports minimal bleeding while in MAU. 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 Patient was seen at [facility] this morning and had an [place] with a FHB.  Passing blood clots? no Blood soaking clothes? no Lightheaded/dizzy? no Significant pelvic pain or cramping? no Passed any tissue? no  Blood Type? O NEG Allergies? NKDA  Pt denies vaginal discharge/odor/itching. Pt denies N/V, abdominal pain, constipation, diarrhea, or urinary problems. Pt denies fever, chills, fatigue, sweating or changes in appetite. Pt denies SOB or chest pain. Pt denies dizziness, HA, light-headedness, weakness.   OB History    Gravida  3   Para      Term      Preterm      AB  2   Living        SAB  2   IAB      Ectopic      Multiple      Live Births              Past Medical History:  Diagnosis Date  . Ovarian cyst   . PTSD (post-traumatic stress disorder)     Past Surgical History:  Procedure Laterality Date  . DILATION AND EVACUATION N/A 03/21/2019   Procedure: DILATATION AND EVACUATION;  Surgeon: Linzie Collin, MD;  Location: [facility] ORS;  Service: Gynecology;  Laterality: N/A;  . KNEE ARTHROSCOPY WITH ANTERIOR CRUCIATE LIGAMENT (ACL) REPAIR WITH HAMSTRING GRAFT    . no history      Family History  Problem Relation Age of Onset  . Cervical cancer Mother   .  Hypertension Father   . Gallstones Paternal Grandmother     Social History   Tobacco Use  . Smoking status: Never Smoker  . Smokeless tobacco: Never Used  Vaping Use  . Vaping Use: Never used  Substance Use Topics  . Alcohol use: Not Currently  . Drug use: Not Currently    Types: Marijuana    Allergies: No Known Allergies  Medications Prior to Admission  Medication Sig Dispense Refill Last Dose  . acetaminophen (TYLENOL) 500 MG tablet Take 1,000 mg by mouth every 6 (six) hours as needed (for pain.).     Marland Kitchen sertraline (ZOLOFT) 50 MG tablet Take 1 tablet (50 mg total) by mouth daily. 30 tablet 1     Review of Systems  Constitutional: Negative for chills, diaphoresis, fatigue and fever.  Eyes: Negative for visual disturbance.  Respiratory: Negative for shortness of breath.   Cardiovascular: Negative for chest pain.  Gastrointestinal: Negative for abdominal pain, constipation, diarrhea, nausea and vomiting.  Genitourinary: Positive for pelvic pain and vaginal bleeding. Negative for dysuria, flank pain, frequency, urgency and vaginal discharge.  Neurological: Negative for dizziness, weakness, light-headedness and headaches.   Physical Exam   Blood pressure 128/82, pulse 89, temperature 98.4 F (36.9 C), temperature source Oral, resp. rate  16, last menstrual period 09/12/2020, SpO2 100 %, unknown if currently breastfeeding.  Patient Vitals for the past 24 hrs:  BP Temp Temp src Pulse Resp SpO2  11/09/20 1808 128/82 98.4 F (36.9 C) Oral 89 16 100 %   Physical Exam Vitals and nursing note reviewed.  Constitutional:      General: She is not in acute distress.    Appearance: Normal appearance. She is not ill-appearing, toxic-appearing or diaphoretic.  HENT:     Head: Normocephalic and atraumatic.  Pulmonary:     Effort: Pulmonary effort is normal.  Neurological:     Mental Status: She is alert and oriented to person, place, and time.  Psychiatric:        Mood and Affect:  Mood normal.        Behavior: Behavior normal.        Thought Content: Thought content normal.        Judgment: Judgment normal.    Results for orders placed or performed during the hospital encounter of 11/09/20 (from the past 24 hour(s))  CBC     Status: Abnormal   Collection Time: 11/09/20  6:35 PM  Result Value Ref Range   WBC 13.1 (H) 4.0 - 10.5 K/uL   RBC 4.94 3.87 - 5.11 MIL/uL   Hemoglobin 13.9 12.0 - 15.0 g/dL   HCT 27.7 41.2 - 87.8 %   MCV 83.0 80.0 - 100.0 fL   MCH 28.1 26.0 - 34.0 pg   MCHC 33.9 30.0 - 36.0 g/dL   RDW 67.6 72.0 - 94.7 %   Platelets 276 150 - 400 K/uL   nRBC 0.0 0.0 - 0.2 %  Comprehensive metabolic panel     Status: Abnormal   Collection Time: 11/09/20  6:35 PM  Result Value Ref Range   Sodium 133 (L) 135 - 145 mmol/L   Potassium 3.9 3.5 - 5.1 mmol/L   Chloride 101 98 - 111 mmol/L   CO2 24 22 - 32 mmol/L   Glucose, Bld 102 (H) 70 - 99 mg/dL   BUN 16 6 - 20 mg/dL   Creatinine, Ser 0.96 0.44 - 1.00 mg/dL   Calcium 9.3 8.9 - 28.3 mg/dL   Total Protein 7.2 6.5 - 8.1 g/dL   Albumin 3.7 3.5 - 5.0 g/dL   AST 18 15 - 41 U/L   ALT 14 0 - 44 U/L   Alkaline Phosphatase 61 38 - 126 U/L   Total Bilirubin 0.5 0.3 - 1.2 mg/dL   GFR, Estimated >66 >29 mL/min   Anion gap 8 5 - 15  Rh IG workup (includes ABO/Rh)     Status: None (Preliminary result)   Collection Time: 11/09/20  6:35 PM  Result Value Ref Range   Gestational Age(Wks) 8.2    ABO/RH(D) O NEG    Antibody Screen NEG    Unit Number [id number]    Blood Component Type RHIG    Unit division 00    Status of Unit ISSUED    Transfusion Status      OK TO TRANSFUSE Performed at [facility], [address], [place], [place] [address]   Urinalysis, Routine w reflex microscopic Urine, Clean Catch     Status: Abnormal   Collection Time: 11/09/20  7:33 PM  Result Value Ref Range   Color, Urine YELLOW YELLOW   APPearance CLEAR CLEAR   Specific Gravity, Urine 1.028 1.005 - 1.030   pH 5.0 5.0 -  8.0   Glucose, UA NEGATIVE  NEGATIVE mg/dL   Hgb urine dipstick LARGE (A) NEGATIVE   Bilirubin Urine NEGATIVE NEGATIVE   Ketones, ur NEGATIVE NEGATIVE mg/dL   Protein, ur NEGATIVE NEGATIVE mg/dL   Nitrite NEGATIVE NEGATIVE   Leukocytes,Ua NEGATIVE NEGATIVE   RBC / HPF >50 (H) 0 - 5 RBC/hpf   WBC, UA 0-5 0 - 5 WBC/hpf   Bacteria, UA RARE (A) NONE SEEN   Squamous Epithelial / LPF 0-5 0 - 5   Mucus PRESENT   Wet prep, genital     Status: Abnormal   Collection Time: 11/09/20  7:33 PM  Result Value Ref Range   Yeast Wet Prep HPF POC NONE SEEN NONE SEEN   Trich, Wet Prep NONE SEEN NONE SEEN   Clue Cells Wet Prep HPF POC NONE SEEN NONE SEEN   WBC, Wet Prep HPF POC MANY (A) NONE SEEN   Sperm NONE SEEN    US OB Comp Less 14 Wks  Result Date: 11/09/2020 CLINICAL DATA:  Vaginal bleeding EXAM: OBSTETRIC <14 WK ULTRASOUND TECHNIQUE: Transabdominal ultrasound was performed for evaluation of the gestation as well as the maternal uterus and adnexal regions. COMPARISON:  None. FINDINGS: Intrauterine gestational sac: Single Yolk sac:  Visualized. Embryo:  Visualized. Cardiac Activity: Visualized. Heart Rate: 173 bpm CRL: 17.7 mm   8 w 1 d                  US EDC: 06/20/2021 Subchorionic hemorrhage: There is moderate subchorionic hemorrhage along the inferior margin of the gestational sac, measuring approximately 4.0 x 2.2 by 2.1 cm. Maternal uterus/adnexae: Left ovary measures 2.2 x 1.6 x 1.6 cm in the right ovary measures 4.0 x 1.7 x 2.0 cm. No adnexal masses or free fluid. IMPRESSION: 1. Single live intrauterine pregnancy as above estimated age 36 weeks and 1 day. 2. Moderate subchorionic hemorrhage. Electronically Signed   By: Sharlet SalinaMichael  Brown M.D.   On: 11/09/2020 19:50   MAU Course  Procedures  MDM -r/o ectopic -UA: lg hgb/rare bacteria -CBC: WNL for pregnancy -CMP: no abnormalities requiring treatment -US: single IUP, +yolk sac, +embryo, +FHR 173, 2427w1d, moderate SCH -hCG: pending at time of  discharge -ABO: O Negative, RhoGAM given -WetPrep: WNL -GC/CT collected -pt discharged to home in stable condition  Orders Placed This Encounter  Procedures  . Wet prep, genital    Standing Status:   Standing    Number of Occurrences:   1  . US OB Comp Less 14 Wks    Standing Status:   Standing    Number of Occurrences:   1    Order Specific Question:   Symptom/Reason for Exam    Answer:   Vaginal bleeding in pregnancy [705036]  . Urinalysis, Routine w reflex microscopic Urine, Clean Catch    Standing Status:   Standing    Number of Occurrences:   1  . CBC    Standing Status:   Standing    Number of Occurrences:   1  . Comprehensive metabolic panel    Standing Status:   Standing    Number of Occurrences:   1  . hCG, quantitative, pregnancy    Standing Status:   Standing    Number of Occurrences:   1  . Rh IG workup (includes ABO/Rh)    Standing Status:   Standing    Number of Occurrences:   1    Order Specific Question:   Weeks of Gestation    Answer:   8.2  Order Specific Question:   RhIG indication:    Answer:   Routine Prenatal or 1st/2nd Trimester Bleeding  . Discharge patient    Order Specific Question:   Discharge disposition    Answer:   01-Home or Self Care [1]    Order Specific Question:   Discharge patient date    Answer:   11/09/2020   Meds ordered this encounter  Medications  . rho (d) immune globulin (RHIG/RHOPHYLAC) injection 300 mcg   Assessment and Plan   1. Subchorionic hemorrhage of placenta in first trimester, fetus 1 of multiple gestation   2. Vaginal bleeding in pregnancy   3. Rh negative state in antepartum period   4. Intrauterine pregnancy   5. [redacted] weeks gestation of pregnancy    Allergies as of 11/09/2020   No Known Allergies     Medication List    TAKE these medications   acetaminophen 500 MG tablet Commonly known as: TYLENOL Take 1,000 mg by mouth every 6 (six) hours as needed (for pain.).   sertraline 50 MG tablet Commonly  known as: Zoloft Take 1 tablet (50 mg total) by mouth daily.      -will call with culture results, if positive -safe meds in pregnancy list given -start OB care -discussed expected s/sx of [facility] -pelvic rest advised -return MAU precautions given -pt discharged to home in stable condition  Odie Sera [place] Kuang 11/09/2020, 8:37 PM

## 2020-11-09 NOTE — MAU Note (Signed)
Sarah Nichols is a 25 y.o. at [redacted]w[redacted]d here in MAU reporting: today while at work she stood up and had a gush of blood. States she put on a pad and saw a large spot of bleeding on there when changing in MAU. No clots. No pain. Did have IC today. Went to [facility] for u/s this AM and they saw a heart beat.  Onset of complaint: today  Pain score: 0/10  Vitals:   11/09/20 1808  BP: 128/82  Pulse: 89  Resp: 16  Temp: 98.4 F (36.9 C)  SpO2: 100%     Lab orders placed from triage: UA

## 2020-11-09 NOTE — Discharge Instructions (Signed)
Subchorionic Hematoma  A hematoma is a collection of blood outside of the blood vessels. A subchorionic hematoma is a collection of blood between the outer wall of the embryo (chorion) and the inner wall of the uterus. This condition can cause vaginal bleeding. Early small hematomas usually shrink on their own and do not affect your baby or pregnancy. When bleeding starts later in pregnancy, or if the hematoma is larger or occurs in older pregnant women, the condition may be more serious. Larger hematomas increase the chances of miscarriage. This condition also increases the risk of:  Premature separation of the placenta from the uterus.  Premature (preterm) labor.  Stillbirth. What are the causes? The exact cause of this condition is not known. It occurs when blood is trapped between the placenta and the uterine wall because the placenta has separated from the original site of implantation. What increases the risk? You are more likely to develop this condition if:  You were treated with fertility medicines.  You became pregnant through in vitro fertilization (IVF). What are the signs or symptoms? Symptoms of this condition include:  Vaginal spotting or bleeding.  Abdominal pain. This is rare. Sometimes you may have no symptoms and the bleeding may only be seen when ultrasound images are taken (transvaginal ultrasound). How is this diagnosed? This condition is diagnosed based on a physical exam. This includes a pelvic exam. You may also have other tests, including:  Blood tests.  Urine tests.  Ultrasound of the abdomen. How is this treated? Treatment for this condition can vary. Treatment may include:  Watchful waiting. You will be monitored closely for any changes in bleeding.  Medicines.  Activity restriction. This may be needed until the bleeding stops.  A medicine called Rh immunoglobulin. This is given if you have an Rh-negative blood type. It prevents Rh  sensitization. Follow these instructions at home:  Stay on bed rest if told to do so by your health care provider.  Do not lift anything that is heavier than 10 lb (4.5 kg), or the limit that you are told by your health care provider.  Track and write down the number of pads you use each day and how soaked (saturated) they are.  Do not use tampons.  Keep all follow-up visits. This is important. Your health care provider may ask you to have follow-up blood tests or ultrasound tests or both. Contact a health care provider if:  You have any vaginal bleeding.  You have a fever. Get help right away if:  You have severe cramps in your stomach, back, abdomen, or pelvis.  You pass large clots or tissue. Save any tissue for your health care provider to look at.  You faint.  You become light-headed or weak. Summary  A subchorionic hematoma is a collection of blood between the outer wall of the embryo (chorion) and the inner wall of the uterus.  This condition can cause vaginal bleeding.  Sometimes you may have no symptoms and the bleeding may only be seen when ultrasound images are taken.  Treatment may include watchful waiting, medicines, or activity restriction.  Keep all follow-up visits. Get help right away if you have severe cramps or heavy vaginal bleeding. This information is not intended to replace advice given to you by your health care provider. Make sure you discuss any questions you have with your health care provider. Document Revised: 04/12/2020 Document Reviewed: 04/12/2020 Elsevier Patient Education  2021 [employer].  Vaginal Bleeding During Pregnancy, First Trimester A small amount of bleeding from the vagina, or spotting, is common during early pregnancy. Some bleeding may be related to the pregnancy, and some may not. In many cases, the bleeding is normal and is not a problem. However, bleeding can also be a sign of something serious. Normal things  that may cause bleeding during the first trimester:  Implantation of the fertilized egg in the lining of the uterus.  Rapid changes in blood vessels. This is caused by changes that are happening to the body during pregnancy.  Sex.  Pelvic exams. Abnormal things that may cause bleeding during the first trimester include:  Infection or inflammation of the cervix.  Growths or polyps on the cervix.  Miscarriage or threatened miscarriage.  Pregnancy that is growing outside of the uterus (ectopic pregnancy).  A fertilized egg that becomes a mass of tissue (molar pregnancy). Tell your health care provider right away if there is any bleeding from your vagina. Follow these instructions at home: Monitoring your bleeding Monitor your bleeding.  Pay attention to any changes in your symptoms. Let your health care provider know about any concerns.  Try to understand when the bleeding occurs. Does the bleeding start on its own, or does it start after something is done, such as sex or a pelvic exam?  Use a diary to record the things you see about your bleeding, including: ? The kind of bleeding you are having. Does the bleeding start and stop irregularly, or is it a constant flow? ? The severity of your bleeding. Is the bleeding heavy or light? ? The number of pads you use each day, how often you change them, and how soaked they are.  Tell your health care provider if you pass tissue. He or she may want to see it.   Activity  Follow instructions from your health care provider about limiting your activity. Ask what activities are safe for you.  Do not have sex until your health care provider says that this is safe.  If needed, make plans for someone to help with your regular activities. General instructions  Take over-the-counter and prescription medicines only as told by your health care provider.  Do not take aspirin because it can cause bleeding.  Do not use tampons or  douche.  Keep all follow-up visits. This is important. Contact a health care provider if:  You have vaginal bleeding during any part of your pregnancy.  You have cramps or labor pains.  You have a fever or chills. Get help right away if:  You have severe cramps in your back or abdomen.  You pass large clots or a large amount of tissue from your vagina.  Your bleeding increases.  You feel light-headed or weak, or you faint.  You are leaking fluid or have a gush of fluid from your vagina. Summary  A small amount of bleeding from the vagina is common during early pregnancy.  Be sure to tell your health care provider about any vaginal bleeding right away.  Try to understand when bleeding occurs. Does bleeding occur on its own, or does it occur after something is done, such as sex or pelvic exams?  Keep all follow-up visits. This is important. This information is not intended to replace advice given to you by your health care provider. Make sure you discuss any questions you have with your health care provider. Document Revised: 04/08/2020 Document Reviewed: 04/08/2020 Elsevier Patient Education  2021 [employer].  Obstetrics: Normal and Problem Pregnancies (7th ed., pp. 102-121). [place], [place]: Elsevier."> Textbook of Family Medicine (9th ed., pp. [phone]). [place], [place]: Elsevier [employer].">  First Trimester of Pregnancy  The first trimester of pregnancy starts on the first day of your last menstrual period until the end of week 12. This is months 1 through 3 of pregnancy. A week after a sperm fertilizes an egg, the egg will implant into the wall of the uterus and begin to develop into a baby. By the end of 12 weeks, all the baby's organs will be formed and the baby will be 2-3 inches in size. Body changes during your first trimester Your body goes through many changes during pregnancy. The changes vary and generally return to normal after your baby is  born. Physical changes  You may gain or lose weight.  Your breasts may begin to grow larger and become tender. The tissue that surrounds your nipples (areola) may become darker.  Dark spots or blotches (chloasma or mask of pregnancy) may develop on your face.  You may have changes in your hair. These can include thickening or thinning of your hair or changes in texture. Health changes  You may feel nauseous, and you may vomit.  You may have heartburn.  You may develop headaches.  You may develop constipation.  Your gums may bleed and may be sensitive to brushing and flossing. Other changes  You may tire easily.  You may urinate more often.  Your menstrual periods will stop.  You may have a loss of appetite.  You may develop cravings for certain kinds of food.  You may have changes in your emotions from day to day.  You may have more vivid and strange dreams. Follow these instructions at home: Medicines  Follow your health care provider's instructions regarding medicine use. Specific medicines may be either safe or unsafe to take during pregnancy. Do not take any medicines unless told to by your health care provider.  Take a prenatal vitamin that contains at least 600 micrograms (mcg) of folic acid. Eating and drinking  Eat a healthy diet that includes fresh fruits and vegetables, whole grains, good sources of protein such as meat, eggs, or tofu, and low-fat dairy products.  Avoid raw meat and unpasteurized juice, milk, and cheese. These carry germs that can harm you and your baby.  If you feel nauseous or you vomit: ? Eat 4 or 5 small meals a day instead of 3 large meals. ? Try eating a few soda crackers. ? Drink liquids between meals instead of during meals.  You may need to take these actions to prevent or treat constipation: ? Drink enough fluid to keep your urine pale yellow. ? Eat foods that are high in fiber, such as beans, whole grains, and fresh fruits  and vegetables. ? Limit foods that are high in fat and processed sugars, such as fried or sweet foods. Activity  Exercise only as directed by your health care provider. Most people can continue their usual exercise routine during pregnancy. Try to exercise for 30 minutes at least 5 days a week.  Stop exercising if you develop pain or cramping in the lower abdomen or lower back.  Avoid exercising if it is very hot or humid or if you are at high altitude.  Avoid heavy lifting.  If you choose to, you may have sex unless your health care provider tells you not to. Relieving pain and discomfort  Wear a good support bra to relieve  breast tenderness.  Rest with your legs elevated if you have leg cramps or low back pain.  If you develop bulging veins (varicose veins) in your legs: ? Wear support hose as told by your health care provider. ? Elevate your feet for 15 minutes, 3-4 times a day. ? Limit salt in your diet. Safety  Wear your seat belt at all times when driving or riding in a car.  Talk with your health care provider if someone is verbally or physically abusive to you.  Talk with your health care provider if you are feeling sad or have thoughts of hurting yourself. Lifestyle  Do not use hot tubs, steam rooms, or saunas.  Do not douche. Do not use tampons or scented sanitary pads.  Do not use herbal remedies, alcohol, illegal drugs, or medicines that are not approved by your health care provider. Chemicals in these products can harm your baby.  Do not use any products that contain nicotine or tobacco, such as cigarettes, e-cigarettes, and chewing tobacco. If you need help quitting, ask your health care provider.  Avoid cat litter boxes and soil used by cats. These carry germs that can cause birth defects in the baby and possibly loss of the unborn baby (fetus) by miscarriage or stillbirth. General instructions  During routine prenatal visits in the first trimester, your  health care provider will do a physical exam, perform necessary tests, and ask you how things are going. Keep all follow-up visits. This is important.  Ask for help if you have counseling or nutritional needs during pregnancy. Your health care provider can offer advice or refer you to specialists for help with various needs.  Schedule a dentist appointment. At home, brush your teeth with a soft toothbrush. Floss gently.  Write down your questions. Take them to your prenatal visits. Where to find more information  American Pregnancy Association: americanpregnancy.org  [employer]st[employer]ians and Gynecologists: https://www.todd-brady.net/acog.org/en/Womens%20Health/Pregnancy  Office on [employer]: [employer].[place]omenshealth.gov/pregnancy Contact a health care provider if you have:  Dizziness.  A fever.  Mild pelvic cramps, pelvic pressure, or nagging pain in the abdominal area.  Nausea, vomiting, or diarrhea that lasts for 24 hours or longer.  A bad-smelling vaginal discharge.  Pain when you urinate.  Known exposure to a contagious illness, such as chickenpox, measles, Zika virus, HIV, or hepatitis. Get help right away if you have:  Spotting or bleeding from your vagina.  Severe abdominal cramping or pain.  Shortness of breath or chest pain.  Any kind of trauma, such as from a fall or a car crash.  New or increased pain, swelling, or redness in an arm or leg. Summary  The first trimester of pregnancy starts on the first day of your last menstrual period until the end of week 12 (months 1 through 3).  Eating 4 or 5 small meals a day rather than 3 large meals may help to relieve nausea and vomiting.  Do not use any products that contain nicotine or tobacco, such as cigarettes, e-cigarettes, and chewing tobacco. If you need help quitting, ask your health care provider.  Keep all follow-up visits. This is important. This information is not intended to replace advice given to you by your health care  provider. Make sure you discuss any questions you have with your health care provider. Document Revised: 12/24/2019 Document Reviewed: 10/30/2019 Elsevier Patient Education  2021 [employer]  Safe Medications in Pregnancy    Acne: Benzoyl Peroxide Salicylic Acid  Backache/Headache: Tylenol: 2 regular strength every 4 hours OR              2 Extra strength every 6 hours  Colds/Coughs/Allergies: Benadryl (alcohol free) 25 mg every 6 hours as needed Breath right strips Claritin Cepacol throat lozenges Chloraseptic throat spray Cold-Eeze- up to three times per day Cough drops, alcohol free Flonase (by prescription only) Guaifenesin Mucinex Robitussin DM (plain only, alcohol free) Saline nasal spray/drops Sudafed (pseudoephedrine) & Actifed ** use only after [redacted] weeks gestation and if you do not have high blood pressure Tylenol Vicks Vaporub Zinc lozenges Zyrtec   Constipation: Colace Ducolax suppositories Fleet enema Glycerin suppositories Metamucil Milk of magnesia Miralax Senokot Smooth move tea  Diarrhea: Kaopectate Imodium A-D  *NO pepto Bismol  Hemorrhoids: Anusol Anusol HC Preparation H Tucks  Indigestion: Tums Maalox Mylanta Zantac  Pepcid  Insomnia: Benadryl (alcohol free) 25mg  every 6 hours as needed Tylenol PM Unisom, no Gelcaps  Leg Cramps: Tums MagGel  Nausea/Vomiting:  Bonine Dramamine Emetrol Ginger extract Sea bands Meclizine  Nausea medication to take during pregnancy:  Unisom (doxylamine succinate 25 mg tablets) Take one tablet daily at bedtime. If symptoms are not adequately controlled, the dose can be increased to a maximum recommended dose of two tablets daily (1/2 tablet in the morning, 1/2 tablet mid-afternoon and one at bedtime). Vitamin B6 100mg  tablets. Take one tablet twice a day (up to 200 mg per day).  Skin Rashes: Aveeno products Benadryl cream or 25mg  every 6 hours as  needed Calamine Lotion 1% cortisone cream  Yeast infection: Gyne-lotrimin 7 Monistat 7   **If taking multiple medications, please check labels to avoid duplicating the same active ingredients **take medication as directed on the label ** Do not exceed 4000 mg of tylenol in 24 hours **Do not take medications that contain aspirin or ibuprofen

## 2020-11-10 ENCOUNTER — Telehealth: Payer: Self-pay | Admitting: Obstetrics and Gynecology

## 2020-11-10 DIAGNOSIS — M25662 Stiffness of left knee, not elsewhere classified: Secondary | ICD-10-CM | POA: Diagnosis not present

## 2020-11-10 DIAGNOSIS — M6281 Muscle weakness (generalized): Secondary | ICD-10-CM | POA: Diagnosis not present

## 2020-11-10 DIAGNOSIS — M25562 Pain in left knee: Secondary | ICD-10-CM | POA: Diagnosis not present

## 2020-11-10 DIAGNOSIS — R262 Difficulty in walking, not elsewhere classified: Secondary | ICD-10-CM | POA: Diagnosis not present

## 2020-11-10 LAB — GC/CHLAMYDIA PROBE AMP (~~LOC~~) NOT AT ARMC
Chlamydia: NEGATIVE
Comment: NEGATIVE
Comment: NORMAL
Neisseria Gonorrhea: NEGATIVE

## 2020-11-10 LAB — RH IG WORKUP (INCLUDES ABO/RH)
ABO/RH(D): O NEG
Antibody Screen: NEGATIVE
Gestational Age(Wks): 8.2
Unit division: 0

## 2020-11-10 NOTE — Telephone Encounter (Signed)
Spoke with patient and she denies any cramping. She said that she is still having some brown blood when she whips. Patient stated that the hospital went over the [place] results with her at the hospital. She does not feel like she needs an appointment at this time. Patient will call back if she feels like she needs to be seen. I offered patient an appointment several times and she declined them.

## 2020-11-10 NOTE — Telephone Encounter (Signed)
Sarah Nichols called in and states she was in the ER last night (11/09/20) for bleeding.  Sarah Nichols states she is [redacted]w[redacted]d.  Sarah Nichols states she was diagnosed with a subchorionic hemorrhage and the ER states baby's heartbeat is strong at 173 and everything looked great.  They told her to follow up make Dr. Logan Bores aware.  Patient states just call her if she needs to come in.  Confirmed number [phone].

## 2020-11-11 DIAGNOSIS — R262 Difficulty in walking, not elsewhere classified: Secondary | ICD-10-CM | POA: Diagnosis not present

## 2020-11-11 DIAGNOSIS — M25662 Stiffness of left knee, not elsewhere classified: Secondary | ICD-10-CM | POA: Diagnosis not present

## 2020-11-11 DIAGNOSIS — M6281 Muscle weakness (generalized): Secondary | ICD-10-CM | POA: Diagnosis not present

## 2020-11-11 DIAGNOSIS — M25562 Pain in left knee: Secondary | ICD-10-CM | POA: Diagnosis not present

## 2020-11-12 DIAGNOSIS — F4325 Adjustment disorder with mixed disturbance of emotions and conduct: Secondary | ICD-10-CM | POA: Diagnosis not present

## 2020-11-16 ENCOUNTER — Telehealth: Payer: Self-pay | Admitting: Obstetrics and Gynecology

## 2020-11-16 NOTE — Telephone Encounter (Signed)
New Message:   Pt called and stated she is [redacted] weeks pregnant and today she has been very dizzy and feels weak.  She is wanting to know if this is normal.  She states she has eating and drinking lots of water like she should.

## 2020-11-17 DIAGNOSIS — F4325 Adjustment disorder with mixed disturbance of emotions and conduct: Secondary | ICD-10-CM | POA: Diagnosis not present

## 2020-11-17 NOTE — Telephone Encounter (Signed)
LM for patient to return call.

## 2020-11-18 ENCOUNTER — Other Ambulatory Visit: Payer: Self-pay

## 2020-11-18 ENCOUNTER — Encounter: Payer: Self-pay | Admitting: Obstetrics and Gynecology

## 2020-11-18 ENCOUNTER — Encounter: Payer: Self-pay | Admitting: Surgical

## 2020-11-18 ENCOUNTER — Ambulatory Visit (INDEPENDENT_AMBULATORY_CARE_PROVIDER_SITE_OTHER): Payer: BC Managed Care – PPO | Admitting: Obstetrics and Gynecology

## 2020-11-18 VITALS — BP 129/78 | HR 90 | Wt 203.3 lb

## 2020-11-18 DIAGNOSIS — F419 Anxiety disorder, unspecified: Secondary | ICD-10-CM | POA: Diagnosis not present

## 2020-11-18 DIAGNOSIS — O4690 Antepartum hemorrhage, unspecified, unspecified trimester: Secondary | ICD-10-CM | POA: Diagnosis not present

## 2020-11-18 DIAGNOSIS — R42 Dizziness and giddiness: Secondary | ICD-10-CM

## 2020-11-18 NOTE — Telephone Encounter (Signed)
Patient is scheduled to see Dr. Logan Bores.

## 2020-11-18 NOTE — Progress Notes (Signed)
HPI:      [name] is a 25 y.o. G3P0020 who LMP was Patient's last menstrual period was 09/12/2020.  Subjective:   She presents today with 2 issues. She was recently seen at [facility] for vaginal bleeding.  Ultrasound was performed showing a subchorionic hemorrhage-please see report.  She states that her bleeding resolved last Thursday and she has not bled since that time.  She has a good understanding of what a subchorionic hemorrhage is. Second issue is that she has had several bouts of lightheadedness.  She does report that she is eating and drinking without problem and that when she eats a full meal it seems to mitigate the issue of lightheadedness.  Snacking does not seem to work as well.    Hx: The following portions of the patient's history were reviewed and updated as appropriate:             She  has a past medical history of Ovarian cyst and PTSD (post-traumatic stress disorder). She does not have any pertinent problems on file. She  has a past surgical history that includes no history; Dilation and evacuation (N/A, 03/21/2019); and Knee arthroscopy with anterior cruciate ligament (acl) repair with hamstring graft. Her family history includes Cervical cancer in her mother; Gallstones in her paternal grandmother; Hypertension in her father. She  reports that she has never smoked. She has never used smokeless tobacco. She reports previous alcohol use. She reports previous drug use. Drug: Marijuana. She has a current medication list which includes the following prescription(s): acetaminophen, prenatal vitamin w/fe, fa, and sertraline. She has No Known Allergies.       Review of Systems:  Review of Systems  Constitutional: Denied constitutional symptoms, night sweats, recent illness, fatigue, fever, insomnia and weight loss.  Eyes: Denied eye symptoms, eye pain, photophobia, vision change and visual disturbance.  Ears/Nose/Throat/Neck: Denied ear, nose,  throat or neck symptoms, hearing loss, nasal discharge, sinus congestion and sore throat.  Cardiovascular: Denied cardiovascular symptoms, arrhythmia, chest pain/pressure, edema, exercise intolerance, orthopnea and palpitations.  Respiratory: Denied pulmonary symptoms, asthma, pleuritic pain, productive sputum, cough, dyspnea and wheezing.  Gastrointestinal: Denied, gastro-esophageal reflux, melena, nausea and vomiting.  Genitourinary: Denied genitourinary symptoms including symptomatic vaginal discharge, pelvic relaxation issues, and urinary complaints.  Musculoskeletal: Denied musculoskeletal symptoms, stiffness, swelling, muscle weakness and myalgia.  Dermatologic: Denied dermatology symptoms, rash and scar.  Neurologic: Denied neurology symptoms, dizziness, headache, neck pain and syncope.  Psychiatric: Denied psychiatric symptoms, anxiety and depression.  Endocrine: Denied endocrine symptoms including hot flashes and night sweats.   Meds:   Current Outpatient Medications on File Prior to Visit  Medication Sig Dispense Refill  . acetaminophen (TYLENOL) 500 MG tablet Take 1,000 mg by mouth every 6 (six) hours as needed (for pain.).    Marland Kitchen prenatal vitamin w/FE, FA (PRENATAL 1 + 1) 27-1 MG TABS tablet Take 1 tablet by mouth daily at 12 noon.    . sertraline (ZOLOFT) 50 MG tablet Take 1 tablet (50 mg total) by mouth daily. 30 tablet 1   No current facility-administered medications on file prior to visit.          Objective:     Vitals:   11/18/20 0951  BP: 129/78  Pulse: 90   Filed Weights   11/18/20 0951  Weight: 203 lb 4.8 oz (92.2 kg)                Assessment:    [id number] Patient Active Problem List  Diagnosis Date Noted  . History of miscarriage, currently pregnant 02/12/2019     1. Vaginal bleeding during pregnancy   2. Episodic lightheadedness   3. Anxiety     Vaginal bleeding secondary to subchorionic hemorrhage no bleeding in approximately 1  week  Episodic like heaviness probably hypoglycemic condition of first trimester.  Patient taking Zoloft for anxiety and states that it is improving.    Plan:            1.  Discussed strategies for lightheadedness.  2.  Continue Zoloft  3.  Patient transferring care. Orders No orders of the defined types were placed in this encounter.   No orders of the defined types were placed in this encounter.     F/U  No follow-ups on file. I spent 23 minutes involved in the care of this patient preparing to see the patient by obtaining and reviewing her medical history (including labs, imaging tests and prior procedures), documenting clinical information in the electronic health record (EHR), counseling and coordinating care plans, writing and sending prescriptions, ordering tests or procedures and directly communicating with the patient by discussing pertinent items from her history and physical exam as well as detailing my assessment and plan as noted above so that she has an informed understanding.  All of her questions were answered.  Elonda Husky, M.D. 11/18/2020 10:17 AM

## 2020-11-19 DIAGNOSIS — M25562 Pain in left knee: Secondary | ICD-10-CM | POA: Diagnosis not present

## 2020-11-19 DIAGNOSIS — R262 Difficulty in walking, not elsewhere classified: Secondary | ICD-10-CM | POA: Diagnosis not present

## 2020-11-19 DIAGNOSIS — M6281 Muscle weakness (generalized): Secondary | ICD-10-CM | POA: Diagnosis not present

## 2020-11-19 DIAGNOSIS — F4325 Adjustment disorder with mixed disturbance of emotions and conduct: Secondary | ICD-10-CM | POA: Diagnosis not present

## 2020-11-19 DIAGNOSIS — M25662 Stiffness of left knee, not elsewhere classified: Secondary | ICD-10-CM | POA: Diagnosis not present

## 2020-11-22 DIAGNOSIS — S83512A Sprain of anterior cruciate ligament of left knee, initial encounter: Secondary | ICD-10-CM | POA: Diagnosis not present

## 2020-11-24 ENCOUNTER — Other Ambulatory Visit: Payer: Self-pay | Admitting: Obstetrics and Gynecology

## 2020-11-24 DIAGNOSIS — F4325 Adjustment disorder with mixed disturbance of emotions and conduct: Secondary | ICD-10-CM | POA: Diagnosis not present

## 2020-11-24 DIAGNOSIS — F32A Depression, unspecified: Secondary | ICD-10-CM

## 2020-11-24 DIAGNOSIS — F419 Anxiety disorder, unspecified: Secondary | ICD-10-CM

## 2020-11-26 DIAGNOSIS — M25562 Pain in left knee: Secondary | ICD-10-CM | POA: Diagnosis not present

## 2020-11-26 DIAGNOSIS — M6281 Muscle weakness (generalized): Secondary | ICD-10-CM | POA: Diagnosis not present

## 2020-11-26 DIAGNOSIS — M25662 Stiffness of left knee, not elsewhere classified: Secondary | ICD-10-CM | POA: Diagnosis not present

## 2020-11-26 DIAGNOSIS — R262 Difficulty in walking, not elsewhere classified: Secondary | ICD-10-CM | POA: Diagnosis not present

## 2020-12-01 DIAGNOSIS — M6281 Muscle weakness (generalized): Secondary | ICD-10-CM | POA: Diagnosis not present

## 2020-12-01 DIAGNOSIS — F4325 Adjustment disorder with mixed disturbance of emotions and conduct: Secondary | ICD-10-CM | POA: Diagnosis not present

## 2020-12-01 DIAGNOSIS — M25562 Pain in left knee: Secondary | ICD-10-CM | POA: Diagnosis not present

## 2020-12-01 DIAGNOSIS — R262 Difficulty in walking, not elsewhere classified: Secondary | ICD-10-CM | POA: Diagnosis not present

## 2020-12-01 DIAGNOSIS — M25662 Stiffness of left knee, not elsewhere classified: Secondary | ICD-10-CM | POA: Diagnosis not present

## 2020-12-03 DIAGNOSIS — F4325 Adjustment disorder with mixed disturbance of emotions and conduct: Secondary | ICD-10-CM | POA: Diagnosis not present

## 2020-12-06 DIAGNOSIS — M25562 Pain in left knee: Secondary | ICD-10-CM | POA: Diagnosis not present

## 2020-12-06 DIAGNOSIS — M25662 Stiffness of left knee, not elsewhere classified: Secondary | ICD-10-CM | POA: Diagnosis not present

## 2020-12-06 DIAGNOSIS — R262 Difficulty in walking, not elsewhere classified: Secondary | ICD-10-CM | POA: Diagnosis not present

## 2020-12-06 DIAGNOSIS — M6281 Muscle weakness (generalized): Secondary | ICD-10-CM | POA: Diagnosis not present

## 2020-12-07 ENCOUNTER — Other Ambulatory Visit: Payer: Self-pay | Admitting: Obstetrics & Gynecology

## 2020-12-07 DIAGNOSIS — Z3682 Encounter for antenatal screening for nuchal translucency: Secondary | ICD-10-CM

## 2020-12-08 ENCOUNTER — Encounter: Payer: Self-pay | Admitting: Women's Health

## 2020-12-08 ENCOUNTER — Ambulatory Visit: Payer: BC Managed Care – PPO | Admitting: *Deleted

## 2020-12-08 ENCOUNTER — Other Ambulatory Visit: Payer: Self-pay

## 2020-12-08 ENCOUNTER — Ambulatory Visit (INDEPENDENT_AMBULATORY_CARE_PROVIDER_SITE_OTHER): Payer: BC Managed Care – PPO | Admitting: Women's Health

## 2020-12-08 ENCOUNTER — Ambulatory Visit (INDEPENDENT_AMBULATORY_CARE_PROVIDER_SITE_OTHER): Payer: BC Managed Care – PPO

## 2020-12-08 VITALS — BP 115/76 | HR 107 | Wt 204.0 lb

## 2020-12-08 DIAGNOSIS — Z3A12 12 weeks gestation of pregnancy: Secondary | ICD-10-CM

## 2020-12-08 DIAGNOSIS — F32A Depression, unspecified: Secondary | ICD-10-CM

## 2020-12-08 DIAGNOSIS — Z3480 Encounter for supervision of other normal pregnancy, unspecified trimester: Secondary | ICD-10-CM

## 2020-12-08 DIAGNOSIS — O26899 Other specified pregnancy related conditions, unspecified trimester: Secondary | ICD-10-CM

## 2020-12-08 DIAGNOSIS — Z3682 Encounter for antenatal screening for nuchal translucency: Secondary | ICD-10-CM | POA: Diagnosis not present

## 2020-12-08 DIAGNOSIS — Z3481 Encounter for supervision of other normal pregnancy, first trimester: Secondary | ICD-10-CM | POA: Diagnosis not present

## 2020-12-08 DIAGNOSIS — Z6791 Unspecified blood type, Rh negative: Secondary | ICD-10-CM | POA: Insufficient documentation

## 2020-12-08 DIAGNOSIS — F4325 Adjustment disorder with mixed disturbance of emotions and conduct: Secondary | ICD-10-CM | POA: Diagnosis not present

## 2020-12-08 DIAGNOSIS — F419 Anxiety disorder, unspecified: Secondary | ICD-10-CM

## 2020-12-08 DIAGNOSIS — Z3400 Encounter for supervision of normal first pregnancy, unspecified trimester: Secondary | ICD-10-CM

## 2020-12-08 DIAGNOSIS — F418 Other specified anxiety disorders: Secondary | ICD-10-CM

## 2020-12-08 DIAGNOSIS — Z3490 Encounter for supervision of normal pregnancy, unspecified, unspecified trimester: Secondary | ICD-10-CM | POA: Insufficient documentation

## 2020-12-08 LAB — POCT URINALYSIS DIPSTICK OB
Blood, UA: NEGATIVE
Glucose, UA: NEGATIVE
Ketones, UA: NEGATIVE
Leukocytes, UA: NEGATIVE
Nitrite, UA: NEGATIVE
POC,PROTEIN,UA: NEGATIVE

## 2020-12-08 MED ORDER — SERTRALINE HCL 25 MG PO TABS: ORAL_TABLET | ORAL | 11 refills | Status: DC

## 2020-12-08 NOTE — Progress Notes (Signed)
INITIAL OBSTETRICAL VISIT Patient name: Sarah Nichols MRN [id number]  Date of birth: May 01, 1996 Chief Complaint:   Initial Prenatal Visit  History of Present Illness:   Sarah Nichols is a 25 y.o. G2P0020 Caucasian female at [redacted]w[redacted]d by LMP c/w u/s at 8 weeks with an Estimated Date of Delivery: 06/19/21 being seen today for her initial obstetrical visit.   Her obstetrical history is significant for SAB x 2.   Today she reports nausea improving. Dep/anx, on zoloft 50mg  x 4wks, helping some but not a lot. Anxiety predominant. Also has PTSD. Has therapist she does virtual visits w/ q2wks.  Vag bleeding earlier in pregnancy, went to ED, dx w/ [facility], received rhogam 4/12 Depression screen PHQ 2/9 12/08/2020  Decreased Interest 1  Down, Depressed, Hopeless 1  PHQ - 2 Score 2  Altered sleeping 2  Tired, decreased energy 3  Change in appetite 1  Feeling bad or failure about yourself  1  Trouble concentrating 2  Moving slowly or fidgety/restless 3  Suicidal thoughts 0  PHQ-9 Score 14    Patient's last menstrual period was 09/12/2020. Last pap 06/10/20. Results were: neg Review of Systems:   Pertinent items are noted in HPI Denies cramping/contractions, leakage of fluid, vaginal bleeding, abnormal vaginal discharge w/ itching/odor/irritation, headaches, visual changes, shortness of breath, chest pain, abdominal pain, severe nausea/vomiting, or problems with urination or bowel movements unless otherwise stated above.  Pertinent History Reviewed:  Reviewed past medical,surgical, social, obstetrical and family history.  Reviewed problem list, medications and allergies. OB History  Gravida Para Term Preterm AB Living  3       2    SAB IAB Ectopic Multiple Live Births  2            # Outcome Date GA Lbr Len/2nd Weight Sex Delivery Anes PTL Lv  3 Current           2 SAB 03/21/19 [redacted]w[redacted]d         1 SAB 11/2018           Physical Assessment:   Vitals:   12/08/20 1218  BP:  115/76  Pulse: (!) 107  Weight: 204 lb (92.5 kg)  Body mass index is 35.02 kg/m.       Physical Examination:  General appearance - well appearing, and in no distress  Mental status - alert, oriented to person, place, and time, legs jumping  Psych:  She has a normal mood and affect  Skin - warm and dry, normal color, no suspicious lesions noted  Chest - effort normal, all lung fields clear to auscultation bilaterally  Heart - normal rate and regular rhythm  Abdomen - soft, nontender  Extremities:  No swelling or varicosities noted  Thin prep pap is not done  Chaperone: N/A    TODAY'S NT  02/07/21 12+3 wks,measurements c/w dates,CRL 62.53 mm,NB present,NT 1.6 mm,fhr 162 bpm,normal ovaries,subchorionic hemorrhage 3.5 x 2.2 x 1.4 cm   Results for orders placed or performed in visit on 12/08/20 (from the past 24 hour(s))  POC Urinalysis Dipstick OB   Collection Time: 12/08/20  2:01 PM  Result Value Ref Range   Color, UA     Clarity, UA     Glucose, UA Negative Negative   Bilirubin, UA     Ketones, UA neg    Spec Grav, UA     Blood, UA neg    pH, UA     POC,PROTEIN,UA Negative Negative, Trace, Small (1+), Moderate (2+), Large (3+),  4+   Urobilinogen, UA     Nitrite, UA neg    Leukocytes, UA Negative Negative   Appearance     Odor      Assessment & Plan:  1) Low-Risk Pregnancy G3P0020 at [redacted]w[redacted]d with an Estimated Date of Delivery: 06/19/21   2) Initial OB visit  3) Dep/anx/PTSD> increase zoloft to 75mg , new rx sent. Continue q2wks w/ therapist  4) SCH> no current vb, received rhogam 11/09/20 for vag bleeding  Meds:  Meds ordered this encounter  Medications  . sertraline (ZOLOFT) 25 MG tablet    Sig: Take daily with 50mg  tablet to equal 75mg  daily    Dispense:  30 tablet    Refill:  11    Order Specific Question:   Supervising Provider    Answer:   01/09/21 H [[id number]]    Initial labs obtained Continue prenatal vitamins Reviewed n/v relief measures and warning s/s to  report Reviewed recommended weight gain based on pre-gravid BMI Encouraged well-balanced diet Genetic & carrier screening discussed: requests Panorama and NT/IT, declines Horizon 14  Ultrasound discussed; fetal survey: requested CCNC completed> form faxed if has or is planning to apply for medicaid The nature of [facility] for with multiple MDs and other Advanced Practice Providers was explained to patient; also emphasized that fellows, residents, and students are part of our team. Does have home bp cuff. Office bp cuff given: no. Check bp weekly, let know if consistently >140/90.   Follow-up: Return in about 3 weeks (around 12/29/2020) for LROB, 2nd IT, in person, CNM.   Orders Placed This Encounter  Procedures  . GC/Chlamydia Probe Amp  . Urine Culture  . Integrated 1  . Genetic Screening  . Pain Management Screening Profile (10S)  . Obstetric Panel, Including HIV  . Hgb Fractionation Cascade  . Hepatitis C antibody  . POC Urinalysis Dipstick OB    [employer] CNM, [facility] 12/08/2020 2:38 PM

## 2020-12-08 NOTE — Progress Notes (Signed)
[place] 12+3 wks,measurements c/w dates,CRL 62.53 mm,NB present,NT 1.6 mm,fhr 162 bpm,normal ovaries,subchorionic hemorrhage 3.5 x 2.2 x 1.4 cm

## 2020-12-08 NOTE — Patient Instructions (Signed)
Vernell MorgansAngela Amendola-Anderson, I greatly value your feedback.  If you receive a survey following your visit with us today, we appreciate you taking the time to fill it out.  Thanks, Joellyn HaffKim Toshio Slusher, CNM, WHNP-BC   [facility] ([address] [place], [place] [address]) Entrance C, located off of [address] Free 24/7 valet parking   Nausea & Vomiting  Have saltine crackers or pretzels by your bed and eat a few bites before you raise your head out of bed in the morning  Eat small frequent meals throughout the day instead of large meals  Drink plenty of fluids throughout the day to stay hydrated, just don't drink a lot of fluids with your meals.  This can make your stomach fill up faster making you feel sick  Do not brush your teeth right after you eat  Products with real ginger are good for nausea, like ginger ale and ginger hard candy Make sure it says made with real ginger!  Sucking on sour candy like lemon heads is also good for nausea  If your prenatal vitamins make you nauseated, take them at night so you will sleep through the nausea  Sea Bands  If you feel like you need medicine for the nausea & vomiting please let us know  If you are unable to keep any fluids or food down please let us know   Constipation  Drink plenty of fluid, preferably water, throughout the day  Eat foods high in fiber such as fruits, vegetables, and grains  Exercise, such as walking, is a good way to keep your bowels regular  Drink warm fluids, especially warm prune juice, or decaf coffee  Eat a 1/2 cup of real oatmeal (not instant), 1/2 cup applesauce, and 1/2-1 cup warm prune juice every day  If needed, you may take Colace (docusate sodium) stool softener once or twice a day to help keep the stool soft.   If you still are having problems with constipation, you may take Miralax once daily as needed to help keep your bowels regular.   Home Blood Pressure Monitoring for  Patients   Your provider has recommended that you check your blood pressure (BP) at least once a week at home. If you do not have a blood pressure cuff at home, one will be provided for you. Contact your provider if you have not received your monitor within 1 week.   Helpful Tips for Accurate Home Blood Pressure Checks  . Don't smoke, exercise, or drink caffeine 30 minutes before checking your BP . Use the restroom before checking your BP (a full bladder can raise your pressure) . Relax in a comfortable upright chair . Feet on the ground . Left arm resting comfortably on a flat surface at the level of your heart . Legs uncrossed . Back supported . Sit quietly and don't talk . Place the cuff on your bare arm . Adjust snuggly, so that only two fingertips can fit between your skin and the top of the cuff . Check 2 readings separated by at least one minute . Keep a log of your BP readings . For a visual, please reference this diagram: http://ccnc.care/bpdiagram  Provider Name: [facility]     Phone: [phone]  Zone 1: ALL CLEAR  Continue to monitor your symptoms:  . BP reading is less than 140 (top number) or less than 90 (bottom number)  . No right upper stomach pain . No headaches or seeing  spots . No feeling nauseated or throwing up . No swelling in face and hands  Zone 2: CAUTION Call your doctor's office for any of the following:  . BP reading is greater than 140 (top number) or greater than 90 (bottom number)  . Stomach pain under your ribs in the middle or right side . Headaches or seeing spots . Feeling nauseated or throwing up . Swelling in face and hands  Zone 3: EMERGENCY  Seek immediate medical care if you have any of the following:  . BP reading is greater than160 (top number) or greater than 110 (bottom number) . Severe headaches not improving with Tylenol . Serious difficulty catching your breath . Any worsening symptoms from Zone 2    First Trimester  of Pregnancy The first trimester of pregnancy is from week 1 until the end of week 12 (months 1 through 3). A week after a sperm fertilizes an egg, the egg will implant on the wall of the uterus. This embryo will begin to develop into a baby. Genes from you and your partner are forming the baby. The female genes determine whether the baby is a boy or a girl. At 6-8 weeks, the eyes and face are formed, and the heartbeat can be seen on ultrasound. At the end of 12 weeks, all the baby's organs are formed.  Now that you are pregnant, you will want to do everything you can to have a healthy baby. Two of the most important things are to get good prenatal care and to follow your health care provider's instructions. Prenatal care is all the medical care you receive before the baby's birth. This care will help prevent, find, and treat any problems during the pregnancy and childbirth. BODY CHANGES Your body goes through many changes during pregnancy. The changes vary from woman to woman.   You may gain or lose a couple of pounds at first.  You may feel sick to your stomach (nauseous) and throw up (vomit). If the vomiting is uncontrollable, call your health care provider.  You may tire easily.  You may develop headaches that can be relieved by medicines approved by your health care provider.  You may urinate more often. Painful urination may mean you have a bladder infection.  You may develop heartburn as a result of your pregnancy.  You may develop constipation because certain hormones are causing the muscles that push waste through your intestines to slow down.  You may develop hemorrhoids or swollen, bulging veins (varicose veins).  Your breasts may begin to grow larger and become tender. Your nipples may stick out more, and the tissue that surrounds them (areola) may become darker.  Your gums may bleed and may be sensitive to brushing and flossing.  Dark spots or blotches (chloasma, mask of  pregnancy) may develop on your face. This will likely fade after the baby is born.  Your menstrual periods will stop.  You may have a loss of appetite.  You may develop cravings for certain kinds of food.  You may have changes in your emotions from day to day, such as being excited to be pregnant or being concerned that something may go wrong with the pregnancy and baby.  You may have more vivid and strange dreams.  You may have changes in your hair. These can include thickening of your hair, rapid growth, and changes in texture. Some women also have hair loss during or after pregnancy, or hair that feels dry or thin. Your hair  will most likely return to normal after your baby is born. WHAT TO EXPECT AT YOUR PRENATAL VISITS During a routine prenatal visit:  You will be weighed to make sure you and the baby are growing normally.  Your blood pressure will be taken.  Your abdomen will be measured to track your baby's growth.  The fetal heartbeat will be listened to starting around week 10 or 12 of your pregnancy.  Test results from any previous visits will be discussed. Your health care provider may ask you:  How you are feeling.  If you are feeling the baby move.  If you have had any abnormal symptoms, such as leaking fluid, bleeding, severe headaches, or abdominal cramping.  If you have any questions. Other tests that may be performed during your first trimester include:  Blood tests to find your blood type and to check for the presence of any previous infections. They will also be used to check for low iron levels (anemia) and Rh antibodies. Later in the pregnancy, blood tests for diabetes will be done along with other tests if problems develop.  Urine tests to check for infections, diabetes, or protein in the urine.  An ultrasound to confirm the proper growth and development of the baby.  An amniocentesis to check for possible genetic problems.  Fetal screens for spina  bifida and Down syndrome.  You may need other tests to make sure you and the baby are doing well. HOME CARE INSTRUCTIONS  Medicines  Follow your health care provider's instructions regarding medicine use. Specific medicines may be either safe or unsafe to take during pregnancy.  Take your prenatal vitamins as directed.  If you develop constipation, try taking a stool softener if your health care provider approves. Diet  Eat regular, well-balanced meals. Choose a variety of foods, such as meat or vegetable-based protein, fish, milk and low-fat dairy products, vegetables, fruits, and whole grain breads and cereals. Your health care provider will help you determine the amount of weight gain that is right for you.  Avoid raw meat and uncooked cheese. These carry germs that can cause birth defects in the baby.  Eating four or five small meals rather than three large meals a day may help relieve nausea and vomiting. If you start to feel nauseous, eating a few soda crackers can be helpful. Drinking liquids between meals instead of during meals also seems to help nausea and vomiting.  If you develop constipation, eat more high-fiber foods, such as fresh vegetables or fruit and whole grains. Drink enough fluids to keep your urine clear or pale yellow. Activity and Exercise  Exercise only as directed by your health care provider. Exercising will help you:  Control your weight.  Stay in shape.  Be prepared for labor and delivery.  Experiencing pain or cramping in the lower abdomen or low back is a good sign that you should stop exercising. Check with your health care provider before continuing normal exercises.  Try to avoid standing for long periods of time. Move your legs often if you must stand in one place for a long time.  Avoid heavy lifting.  Wear low-heeled shoes, and practice good posture.  You may continue to have sex unless your health care provider directs you  otherwise. Relief of Pain or Discomfort  Wear a good support bra for breast tenderness.    Take warm sitz baths to soothe any pain or discomfort caused by hemorrhoids. Use hemorrhoid cream if your health care provider approves.  Rest with your legs elevated if you have leg cramps or low back pain.  If you develop varicose veins in your legs, wear support hose. Elevate your feet for 15 minutes, 3-4 times a day. Limit salt in your diet. Prenatal Care  Schedule your prenatal visits by the twelfth week of pregnancy. They are usually scheduled monthly at first, then more often in the last 2 months before delivery.  Write down your questions. Take them to your prenatal visits.  Keep all your prenatal visits as directed by your health care provider. Safety  Wear your seat belt at all times when driving.  Make a list of emergency phone numbers, including numbers for family, friends, the hospital, and police and fire departments. General Tips  Ask your health care provider for a referral to a local prenatal education class. Begin classes no later than at the beginning of month 6 of your pregnancy.  Ask for help if you have counseling or nutritional needs during pregnancy. Your health care provider can offer advice or refer you to specialists for help with various needs.  Do not use hot tubs, steam rooms, or saunas.  Do not douche or use tampons or scented sanitary pads.  Do not cross your legs for long periods of time.  Avoid cat litter boxes and soil used by cats. These carry germs that can cause birth defects in the baby and possibly loss of the fetus by miscarriage or stillbirth.  Avoid all smoking, herbs, alcohol, and medicines not prescribed by your health care provider. Chemicals in these affect the formation and growth of the baby.  Schedule a dentist appointment. At home, brush your teeth with a soft toothbrush and be gentle when you floss. SEEK MEDICAL CARE IF:   You have  dizziness.  You have mild pelvic cramps, pelvic pressure, or nagging pain in the abdominal area.  You have persistent nausea, vomiting, or diarrhea.  You have a bad smelling vaginal discharge.  You have pain with urination.  You notice increased swelling in your face, hands, legs, or ankles. SEEK IMMEDIATE MEDICAL CARE IF:   You have a fever.  You are leaking fluid from your vagina.  You have spotting or bleeding from your vagina.  You have severe abdominal cramping or pain.  You have rapid weight gain or loss.  You vomit blood or material that looks like coffee grounds.  You are exposed to [place] measles and have never had them.  You are exposed to fifth disease or chickenpox.  You develop a severe headache.  You have shortness of breath.  You have any kind of trauma, such as from a fall or a car accident. Document Released: 07/11/2001 Document Revised: 12/01/2013 Document Reviewed: 05/27/2013 [facility] Patient Information 2015 [place], [place]. This information is not intended to replace advice given to you by your health care provider. Make sure you discuss any questions you have with your health care provider.    Subchorionic Hematoma  A hematoma is a collection of blood outside of the blood vessels. A subchorionic hematoma is a collection of blood between the outer wall of the embryo (chorion) and the inner wall of the uterus. This condition can cause vaginal bleeding. Early small hematomas usually shrink on their own and do not affect your baby or pregnancy. When bleeding starts later in pregnancy, or if the hematoma is larger or occurs in older pregnant women, the condition may be more serious. Larger hematomas increase the chances of miscarriage. This condition also  increases the risk of:  Premature separation of the placenta from the uterus.  Premature (preterm) labor.  Stillbirth. What are the causes? The exact cause of this condition is not known. It occurs  when blood is trapped between the placenta and the uterine wall because the placenta has separated from the original site of implantation. What increases the risk? You are more likely to develop this condition if:  You were treated with fertility medicines.  You became pregnant through in vitro fertilization (IVF). What are the signs or symptoms? Symptoms of this condition include:  Vaginal spotting or bleeding.  Abdominal pain. This is rare. Sometimes you may have no symptoms and the bleeding may only be seen when ultrasound images are taken (transvaginal ultrasound). How is this diagnosed? This condition is diagnosed based on a physical exam. This includes a pelvic exam. You may also have other tests, including:  Blood tests.  Urine tests.  Ultrasound of the abdomen. How is this treated? Treatment for this condition can vary. Treatment may include:  Watchful waiting. You will be monitored closely for any changes in bleeding.  Medicines.  Activity restriction. This may be needed until the bleeding stops.  A medicine called Rh immunoglobulin. This is given if you have an Rh-negative blood type. It prevents Rh sensitization. Follow these instructions at home:  Stay on bed rest if told to do so by your health care provider.  Do not lift anything that is heavier than 10 lb (4.5 kg), or the limit that you are told by your health care provider.  Track and write down the number of pads you use each day and how soaked (saturated) they are.  Do not use tampons.  Keep all follow-up visits. This is important. Your health care provider may ask you to have follow-up blood tests or ultrasound tests or both. Contact a health care provider if:  You have any vaginal bleeding.  You have a fever. Get help right away if:  You have severe cramps in your stomach, back, abdomen, or pelvis.  You pass large clots or tissue. Save any tissue for your health care provider to look at.  You  faint.  You become light-headed or weak. Summary  A subchorionic hematoma is a collection of blood between the outer wall of the embryo (chorion) and the inner wall of the uterus.  This condition can cause vaginal bleeding.  Sometimes you may have no symptoms and the bleeding may only be seen when ultrasound images are taken.  Treatment may include watchful waiting, medicines, or activity restriction.  Keep all follow-up visits. Get help right away if you have severe cramps or heavy vaginal bleeding. This information is not intended to replace advice given to you by your health care provider. Make sure you discuss any questions you have with your health care provider. Document Revised: 04/12/2020 Document Reviewed: 04/12/2020 Elsevier Patient Education  2021 [employer].

## 2020-12-09 LAB — PMP SCREEN PROFILE (10S), URINE
Amphetamine Scrn, Ur: NEGATIVE ng/mL
BARBITURATE SCREEN URINE: NEGATIVE ng/mL
BENZODIAZEPINE SCREEN, URINE: NEGATIVE ng/mL
CANNABINOIDS UR QL SCN: NEGATIVE ng/mL
Cocaine (Metab) Scrn, Ur: NEGATIVE ng/mL
Creatinine(Crt), U: 36.5 mg/dL (ref 20.0–300.0)
Methadone Screen, Urine: NEGATIVE ng/mL
OXYCODONE+OXYMORPHONE UR QL SCN: NEGATIVE ng/mL
Opiate Scrn, Ur: NEGATIVE ng/mL
Ph of Urine: 6.6 (ref 4.5–8.9)
Phencyclidine Qn, Ur: NEGATIVE ng/mL
Propoxyphene Scrn, Ur: NEGATIVE ng/mL

## 2020-12-10 DIAGNOSIS — F4325 Adjustment disorder with mixed disturbance of emotions and conduct: Secondary | ICD-10-CM | POA: Diagnosis not present

## 2020-12-10 LAB — OBSTETRIC PANEL, INCLUDING HIV
Basophils Absolute: 0 10*3/uL (ref 0.0–0.2)
Basos: 0 %
EOS (ABSOLUTE): 0.1 10*3/uL (ref 0.0–0.4)
Eos: 1 %
HIV Screen 4th Generation wRfx: NONREACTIVE
Hematocrit: 39.6 % (ref 34.0–46.6)
Hemoglobin: 12.9 g/dL (ref 11.1–15.9)
Hepatitis B Surface Ag: NEGATIVE
Immature Grans (Abs): 0 10*3/uL (ref 0.0–0.1)
Immature Granulocytes: 0 %
Lymphocytes Absolute: 0.8 10*3/uL (ref 0.7–3.1)
Lymphs: 8 %
MCH: 27.8 pg (ref 26.6–33.0)
MCHC: 32.6 g/dL (ref 31.5–35.7)
MCV: 85 fL (ref 79–97)
Monocytes Absolute: 0.6 10*3/uL (ref 0.1–0.9)
Monocytes: 6 %
Neutrophils Absolute: 8.2 10*3/uL — ABNORMAL HIGH (ref 1.4–7.0)
Neutrophils: 85 %
Platelets: 240 10*3/uL (ref 150–450)
RBC: 4.64 x10E6/uL (ref 3.77–5.28)
RDW: 12.2 % (ref 11.7–15.4)
RPR Ser Ql: NONREACTIVE
Rh Factor: NEGATIVE
Rubella Antibodies, IGG: 7.24 index (ref >0.99)
WBC: 9.7 10*3/uL (ref 3.4–10.8)

## 2020-12-10 LAB — INTEGRATED 1
Crown Rump Length: 62.5 mm
Gest. Age on Collection Date: 12.4 weeks
Maternal Age at EDD: 26.4 yr
Nuchal Translucency (NT): 1.6 mm
Number of Fetuses: 1
PAPP-A Value: 828.2 ng/mL
Weight: 204 [lb_av]

## 2020-12-10 LAB — GC/CHLAMYDIA PROBE AMP
Chlamydia trachomatis, NAA: NEGATIVE
Neisseria Gonorrhoeae by PCR: NEGATIVE

## 2020-12-10 LAB — AB SCR+ANTIBODY ID: Antibody Screen: POSITIVE — AB

## 2020-12-10 LAB — HEPATITIS C ANTIBODY: Hep C Virus Ab: <0.1 s/co ratio (ref 0.0–0.9)

## 2020-12-10 LAB — HGB FRACTIONATION CASCADE
Hgb A2: 2.9 % (ref 1.8–3.2)
Hgb A: 97.1 % (ref 96.4–98.8)
Hgb F: 0 % (ref 0.0–2.0)
Hgb S: 0 %

## 2020-12-11 LAB — URINE CULTURE

## 2020-12-14 ENCOUNTER — Encounter: Payer: BC Managed Care – PPO | Admitting: Obstetrics and Gynecology

## 2020-12-14 DIAGNOSIS — F4325 Adjustment disorder with mixed disturbance of emotions and conduct: Secondary | ICD-10-CM | POA: Diagnosis not present

## 2020-12-15 DIAGNOSIS — M25662 Stiffness of left knee, not elsewhere classified: Secondary | ICD-10-CM | POA: Diagnosis not present

## 2020-12-15 DIAGNOSIS — M25562 Pain in left knee: Secondary | ICD-10-CM | POA: Diagnosis not present

## 2020-12-15 DIAGNOSIS — R262 Difficulty in walking, not elsewhere classified: Secondary | ICD-10-CM | POA: Diagnosis not present

## 2020-12-15 DIAGNOSIS — M6281 Muscle weakness (generalized): Secondary | ICD-10-CM | POA: Diagnosis not present

## 2020-12-15 DIAGNOSIS — F4325 Adjustment disorder with mixed disturbance of emotions and conduct: Secondary | ICD-10-CM | POA: Diagnosis not present

## 2020-12-16 DIAGNOSIS — R262 Difficulty in walking, not elsewhere classified: Secondary | ICD-10-CM | POA: Diagnosis not present

## 2020-12-16 DIAGNOSIS — M25662 Stiffness of left knee, not elsewhere classified: Secondary | ICD-10-CM | POA: Diagnosis not present

## 2020-12-16 DIAGNOSIS — M6281 Muscle weakness (generalized): Secondary | ICD-10-CM | POA: Diagnosis not present

## 2020-12-16 DIAGNOSIS — M25562 Pain in left knee: Secondary | ICD-10-CM | POA: Diagnosis not present

## 2020-12-20 ENCOUNTER — Ambulatory Visit: Payer: BC Managed Care – PPO

## 2020-12-22 DIAGNOSIS — F4325 Adjustment disorder with mixed disturbance of emotions and conduct: Secondary | ICD-10-CM | POA: Diagnosis not present

## 2020-12-23 DIAGNOSIS — R262 Difficulty in walking, not elsewhere classified: Secondary | ICD-10-CM | POA: Diagnosis not present

## 2020-12-23 DIAGNOSIS — M6281 Muscle weakness (generalized): Secondary | ICD-10-CM | POA: Diagnosis not present

## 2020-12-23 DIAGNOSIS — M25662 Stiffness of left knee, not elsewhere classified: Secondary | ICD-10-CM | POA: Diagnosis not present

## 2020-12-23 DIAGNOSIS — M25562 Pain in left knee: Secondary | ICD-10-CM | POA: Diagnosis not present

## 2020-12-24 DIAGNOSIS — F4325 Adjustment disorder with mixed disturbance of emotions and conduct: Secondary | ICD-10-CM | POA: Diagnosis not present

## 2020-12-29 DIAGNOSIS — M25662 Stiffness of left knee, not elsewhere classified: Secondary | ICD-10-CM | POA: Diagnosis not present

## 2020-12-29 DIAGNOSIS — M25562 Pain in left knee: Secondary | ICD-10-CM | POA: Diagnosis not present

## 2020-12-29 DIAGNOSIS — M6281 Muscle weakness (generalized): Secondary | ICD-10-CM | POA: Diagnosis not present

## 2020-12-29 DIAGNOSIS — R262 Difficulty in walking, not elsewhere classified: Secondary | ICD-10-CM | POA: Diagnosis not present

## 2020-12-30 ENCOUNTER — Other Ambulatory Visit: Payer: Self-pay | Admitting: Women's Health

## 2020-12-30 DIAGNOSIS — M6281 Muscle weakness (generalized): Secondary | ICD-10-CM | POA: Diagnosis not present

## 2020-12-30 DIAGNOSIS — F4325 Adjustment disorder with mixed disturbance of emotions and conduct: Secondary | ICD-10-CM | POA: Diagnosis not present

## 2020-12-30 DIAGNOSIS — M25562 Pain in left knee: Secondary | ICD-10-CM | POA: Diagnosis not present

## 2020-12-30 DIAGNOSIS — M25662 Stiffness of left knee, not elsewhere classified: Secondary | ICD-10-CM | POA: Diagnosis not present

## 2020-12-30 DIAGNOSIS — R262 Difficulty in walking, not elsewhere classified: Secondary | ICD-10-CM | POA: Diagnosis not present

## 2021-01-03 ENCOUNTER — Encounter: Payer: Self-pay | Admitting: Obstetrics & Gynecology

## 2021-01-03 ENCOUNTER — Other Ambulatory Visit: Payer: Self-pay

## 2021-01-03 ENCOUNTER — Ambulatory Visit (INDEPENDENT_AMBULATORY_CARE_PROVIDER_SITE_OTHER): Payer: BC Managed Care – PPO | Admitting: Obstetrics & Gynecology

## 2021-01-03 ENCOUNTER — Encounter: Payer: Self-pay | Admitting: Women's Health

## 2021-01-03 VITALS — BP 117/80 | HR 84 | Wt 203.6 lb

## 2021-01-03 DIAGNOSIS — M25662 Stiffness of left knee, not elsewhere classified: Secondary | ICD-10-CM | POA: Diagnosis not present

## 2021-01-03 DIAGNOSIS — R262 Difficulty in walking, not elsewhere classified: Secondary | ICD-10-CM | POA: Diagnosis not present

## 2021-01-03 DIAGNOSIS — Z1379 Encounter for other screening for genetic and chromosomal anomalies: Secondary | ICD-10-CM

## 2021-01-03 DIAGNOSIS — M25562 Pain in left knee: Secondary | ICD-10-CM | POA: Diagnosis not present

## 2021-01-03 DIAGNOSIS — Z3482 Encounter for supervision of other normal pregnancy, second trimester: Secondary | ICD-10-CM

## 2021-01-03 DIAGNOSIS — M6281 Muscle weakness (generalized): Secondary | ICD-10-CM | POA: Diagnosis not present

## 2021-01-03 NOTE — Progress Notes (Signed)
   LOW-RISK PREGNANCY VISIT Patient name: Sarah Nichols MRN [id number]  Date of birth: 01-24-1996 Chief Complaint:   Routine Prenatal Visit  History of Present Illness:   Sarah Nichols is a 25 y.o. G48P0020 female at [redacted]w[redacted]d with an Estimated Date of Delivery: 06/19/21 being seen today for ongoing management of a low-risk pregnancy.   Anxiety- zoloft 75mg  daily- doing well  Depression screen [facility] 2/9 12/08/2020  Decreased Interest 1  Down, Depressed, Hopeless 1  PHQ - 2 Score 2  Altered sleeping 2  Tired, decreased energy 3  Change in appetite 1  Feeling bad or failure about yourself  1  Trouble concentrating 2  Moving slowly or fidgety/restless 3  Suicidal thoughts 0  PHQ-9 Score 14    Today she reports no complaints. Contractions: Not present. Vag. Bleeding: Scant.  Movement: Absent. denies leaking of fluid. Review of Systems:   Pertinent items are noted in HPI Denies abnormal vaginal discharge w/ itching/odor/irritation, headaches, visual changes, shortness of breath, chest pain, abdominal pain, severe nausea/vomiting, or problems with urination or bowel movements unless otherwise stated above. Pertinent History Reviewed:  Reviewed past medical,surgical, social, obstetrical and family history.  Reviewed problem list, medications and allergies.  Physical Assessment:   Vitals:   01/03/21 1002  BP: 117/80  Pulse: 84  Weight: 203 lb 9.6 oz (92.4 kg)  Body mass index is 34.95 kg/m.        Physical Examination:   General appearance: Well appearing, and in no distress  Mental status: Alert, oriented to person, place, and time  Skin: Warm & dry  Respiratory: Normal respiratory effort, no distress  Abdomen: Soft, gravid, nontender  Pelvic: Cervical exam deferred         Extremities: Edema: None  Psych:  mood and affect appropriate  Fetal Status: Fetal Heart Rate (bpm): 160   Movement: Absent    Chaperone: n/a    No results found for this or any  previous visit (from the past 24 hour(s)).   Assessment & Plan:  1) Low-risk pregnancy G3P0020 at [redacted]w[redacted]d with an Estimated Date of Delivery: 06/19/21   2) Anxiety- zoloft 75mg  daily  Meds: No orders of the defined types were placed in this encounter.  Labs/procedures today: AFP today, 06/21/21 next visit  Plan:  Continue routine obstetrical care  Next visit: prefers in person    Reviewed: Preterm labor symptoms and general obstetric precautions including but not limited to vaginal bleeding, contractions, leaking of fluid and fetal movement were reviewed in detail with the patient.  All questions were answered.   Follow-up: Return in about 4 weeks (around 01/31/2021) for LROB visit and anatomy scan.  Orders Placed This Encounter  Procedures  . INTEGRATED 2    [place], DO Attending Obstetrician & Gynecologist, [facility] [place], [facility]

## 2021-01-04 DIAGNOSIS — M25662 Stiffness of left knee, not elsewhere classified: Secondary | ICD-10-CM | POA: Diagnosis not present

## 2021-01-04 DIAGNOSIS — M25562 Pain in left knee: Secondary | ICD-10-CM | POA: Diagnosis not present

## 2021-01-04 DIAGNOSIS — R262 Difficulty in walking, not elsewhere classified: Secondary | ICD-10-CM | POA: Diagnosis not present

## 2021-01-04 DIAGNOSIS — M6281 Muscle weakness (generalized): Secondary | ICD-10-CM | POA: Diagnosis not present

## 2021-01-04 DIAGNOSIS — F4325 Adjustment disorder with mixed disturbance of emotions and conduct: Secondary | ICD-10-CM | POA: Diagnosis not present

## 2021-01-05 LAB — INTEGRATED 2
AFP MoM: 0.74
Alpha-Fetoprotein: 19.6 ng/mL
Crown Rump Length: 62.5 mm
DIA MoM: 0.95
DIA Value: 122.2 pg/mL
Estriol, Unconjugated: 0.99 ng/mL
Gest. Age on Collection Date: 12.4 weeks
Gestational Age: 16.1 weeks
Maternal Age at EDD: 26.4 yr
Nuchal Translucency (NT): 1.6 mm
Nuchal Translucency MoM: 1.15
Number of Fetuses: 1
PAPP-A MoM: 1.25
PAPP-A Value: 828.2 ng/mL
Test Results:: NEGATIVE
Weight: 203 [lb_av]
Weight: 204 [lb_av]
hCG MoM: 1.47
hCG Value: 42.4 IU/mL
uE3 MoM: 1.16

## 2021-01-06 DIAGNOSIS — F4325 Adjustment disorder with mixed disturbance of emotions and conduct: Secondary | ICD-10-CM | POA: Diagnosis not present

## 2021-01-11 DIAGNOSIS — F4325 Adjustment disorder with mixed disturbance of emotions and conduct: Secondary | ICD-10-CM | POA: Diagnosis not present

## 2021-01-12 DIAGNOSIS — M6281 Muscle weakness (generalized): Secondary | ICD-10-CM | POA: Diagnosis not present

## 2021-01-12 DIAGNOSIS — M25662 Stiffness of left knee, not elsewhere classified: Secondary | ICD-10-CM | POA: Diagnosis not present

## 2021-01-12 DIAGNOSIS — M25562 Pain in left knee: Secondary | ICD-10-CM | POA: Diagnosis not present

## 2021-01-12 DIAGNOSIS — R262 Difficulty in walking, not elsewhere classified: Secondary | ICD-10-CM | POA: Diagnosis not present

## 2021-01-13 DIAGNOSIS — F4325 Adjustment disorder with mixed disturbance of emotions and conduct: Secondary | ICD-10-CM | POA: Diagnosis not present

## 2021-01-17 DIAGNOSIS — M6281 Muscle weakness (generalized): Secondary | ICD-10-CM | POA: Diagnosis not present

## 2021-01-17 DIAGNOSIS — M25562 Pain in left knee: Secondary | ICD-10-CM | POA: Diagnosis not present

## 2021-01-17 DIAGNOSIS — M25662 Stiffness of left knee, not elsewhere classified: Secondary | ICD-10-CM | POA: Diagnosis not present

## 2021-01-17 DIAGNOSIS — R262 Difficulty in walking, not elsewhere classified: Secondary | ICD-10-CM | POA: Diagnosis not present

## 2021-01-19 DIAGNOSIS — R262 Difficulty in walking, not elsewhere classified: Secondary | ICD-10-CM | POA: Diagnosis not present

## 2021-01-19 DIAGNOSIS — M6281 Muscle weakness (generalized): Secondary | ICD-10-CM | POA: Diagnosis not present

## 2021-01-19 DIAGNOSIS — M25562 Pain in left knee: Secondary | ICD-10-CM | POA: Diagnosis not present

## 2021-01-19 DIAGNOSIS — M25662 Stiffness of left knee, not elsewhere classified: Secondary | ICD-10-CM | POA: Diagnosis not present

## 2021-01-19 DIAGNOSIS — F4325 Adjustment disorder with mixed disturbance of emotions and conduct: Secondary | ICD-10-CM | POA: Diagnosis not present

## 2021-01-21 DIAGNOSIS — F4325 Adjustment disorder with mixed disturbance of emotions and conduct: Secondary | ICD-10-CM | POA: Diagnosis not present

## 2021-01-24 DIAGNOSIS — F4325 Adjustment disorder with mixed disturbance of emotions and conduct: Secondary | ICD-10-CM | POA: Diagnosis not present

## 2021-01-25 DIAGNOSIS — M25562 Pain in left knee: Secondary | ICD-10-CM | POA: Diagnosis not present

## 2021-01-25 DIAGNOSIS — M25662 Stiffness of left knee, not elsewhere classified: Secondary | ICD-10-CM | POA: Diagnosis not present

## 2021-01-25 DIAGNOSIS — M6281 Muscle weakness (generalized): Secondary | ICD-10-CM | POA: Diagnosis not present

## 2021-01-25 DIAGNOSIS — R262 Difficulty in walking, not elsewhere classified: Secondary | ICD-10-CM | POA: Diagnosis not present

## 2021-01-26 DIAGNOSIS — F4325 Adjustment disorder with mixed disturbance of emotions and conduct: Secondary | ICD-10-CM | POA: Diagnosis not present

## 2021-01-28 ENCOUNTER — Other Ambulatory Visit: Payer: Self-pay | Admitting: Obstetrics & Gynecology

## 2021-01-28 DIAGNOSIS — F4325 Adjustment disorder with mixed disturbance of emotions and conduct: Secondary | ICD-10-CM | POA: Diagnosis not present

## 2021-01-28 DIAGNOSIS — Z363 Encounter for antenatal screening for malformations: Secondary | ICD-10-CM

## 2021-01-28 DIAGNOSIS — R262 Difficulty in walking, not elsewhere classified: Secondary | ICD-10-CM | POA: Diagnosis not present

## 2021-01-28 DIAGNOSIS — M25662 Stiffness of left knee, not elsewhere classified: Secondary | ICD-10-CM | POA: Diagnosis not present

## 2021-01-28 DIAGNOSIS — M6281 Muscle weakness (generalized): Secondary | ICD-10-CM | POA: Diagnosis not present

## 2021-01-28 DIAGNOSIS — M25562 Pain in left knee: Secondary | ICD-10-CM | POA: Diagnosis not present

## 2021-02-01 ENCOUNTER — Encounter: Payer: Self-pay | Admitting: Women's Health

## 2021-02-01 ENCOUNTER — Ambulatory Visit (INDEPENDENT_AMBULATORY_CARE_PROVIDER_SITE_OTHER): Payer: BC Managed Care – PPO

## 2021-02-01 ENCOUNTER — Other Ambulatory Visit: Payer: Self-pay

## 2021-02-01 ENCOUNTER — Ambulatory Visit (INDEPENDENT_AMBULATORY_CARE_PROVIDER_SITE_OTHER): Payer: BC Managed Care – PPO | Admitting: Women's Health

## 2021-02-01 VITALS — BP 119/77 | HR 83 | Wt 207.2 lb

## 2021-02-01 DIAGNOSIS — Z3A20 20 weeks gestation of pregnancy: Secondary | ICD-10-CM

## 2021-02-01 DIAGNOSIS — Z3480 Encounter for supervision of other normal pregnancy, unspecified trimester: Secondary | ICD-10-CM

## 2021-02-01 DIAGNOSIS — Z363 Encounter for antenatal screening for malformations: Secondary | ICD-10-CM | POA: Diagnosis not present

## 2021-02-01 DIAGNOSIS — Z3482 Encounter for supervision of other normal pregnancy, second trimester: Secondary | ICD-10-CM

## 2021-02-01 DIAGNOSIS — O26899 Other specified pregnancy related conditions, unspecified trimester: Secondary | ICD-10-CM

## 2021-02-01 NOTE — Progress Notes (Signed)
[place] 20+2 wks,breech,anterior placenta gr 0,normal ovaries,cx 3.1 cm,svp of fluid 6.2 cm,fhr 174 bpm,EFW 329 g 33%,anatomy complete,no obvious abnormalities

## 2021-02-01 NOTE — Progress Notes (Signed)
    LOW-RISK PREGNANCY VISIT Patient name: Sarah Nichols MRN [id number]  Date of birth: [birth year] Chief Complaint:   Routine Prenatal Visit  History of Present Illness:   Sarah Nichols is a 25 y.o. G88P0020 female at [redacted]w[redacted]d with an Estimated Date of Delivery: 06/19/21 being seen today for ongoing management of a low-risk pregnancy.   Today she reports no complaints. Interested in Systems developer. Contractions: Not present. Vag. Bleeding: None.  Movement: Present. denies leaking of fluid.  Depression screen PHQ 2/9 12/08/2020  Decreased Interest 1  Down, Depressed, Hopeless 1  PHQ - 2 Score 2  Altered sleeping 2  Tired, decreased energy 3  Change in appetite 1  Feeling bad or failure about yourself  1  Trouble concentrating 2  Moving slowly or fidgety/restless 3  Suicidal thoughts 0  PHQ-9 Score 14     GAD 7 : Generalized Anxiety Score 12/08/2020  Nervous, Anxious, on Edge 3  Control/stop worrying 2  Worry too much - different things 2  Trouble relaxing 1  Restless 2  Easily annoyed or irritable 2  Afraid - awful might happen 2  Total GAD 7 Score 14      Review of Systems:   Pertinent items are noted in HPI Denies abnormal vaginal discharge w/ itching/odor/irritation, headaches, visual changes, shortness of breath, chest pain, abdominal pain, severe nausea/vomiting, or problems with urination or bowel movements unless otherwise stated above. Pertinent History Reviewed:  Reviewed past medical,surgical, social, obstetrical and family history.  Reviewed problem list, medications and allergies. Physical Assessment:   Vitals:   02/01/21 1021  BP: 119/77  Pulse: 83  Weight: 207 lb 3.2 oz (94 kg)  Body mass index is 35.57 kg/m.        Physical Examination:   General appearance: Well appearing, and in no distress  Mental status: Alert, oriented to person, place, and time  Skin: Warm & dry  Cardiovascular: Normal heart rate noted  Respiratory: Normal  respiratory effort, no distress  Abdomen: Soft, gravid, nontender  Pelvic: Cervical exam deferred         Extremities: Edema: None  Fetal Status:     Movement: Present   [place] 20+2 wks,breech,anterior placenta gr 0,normal ovaries,cx 3.1 cm,svp of fluid 6.2 cm,fhr 174 bpm,EFW 329 g 33%,anatomy complete,no obvious abnormalities  Chaperone: N/A   No results found for this or any previous visit (from the past 24 hour(s)).  Assessment & Plan:  1) Low-risk pregnancy G3P0020 at [redacted]w[redacted]d with an Estimated Date of Delivery: 06/19/21   2) Interested in waterbirth, gave printed info, sign up for class   Meds: No orders of the defined types were placed in this encounter.  Labs/procedures today: U/S  Plan:  Continue routine obstetrical care  Next visit: prefers in person    Reviewed: Preterm labor symptoms and general obstetric precautions including but not limited to vaginal bleeding, contractions, leaking of fluid and fetal movement were reviewed in detail with the patient.  All questions were answered. Does have home bp cuff. Office bp cuff given: not applicable. Check bp weekly, let us know if consistently >140 and/or >90.  Follow-up: No follow-ups on file.  Future Appointments  Date Time Provider Department Center  02/01/2021 10:30 AM Cheral Marker, CNM [facility]    No orders of the defined types were placed in this encounter.  Cheral Marker CNM, [facility] 02/01/2021 10:28 AM

## 2021-02-01 NOTE — Patient Instructions (Addendum)
Sarah Nichols, thank you for choosing our office today! We appreciate the opportunity to meet your healthcare needs. You may receive a short survey by mail, e-mail, or through MyChart. If you are happy with your care we would appreciate if you could take just a few minutes to complete the survey questions. We read all of your comments and take your feedback very seriously. Thank you again for choosing our office.  [facility] Team at [facility] [place] ([address] [place], [place] [address]) Entrance C, located off of [address] Free 24/7 valet parking  Go to [email or web address] to register for FREE online childbirth classes  Call the office ([phone]) or go to [facility] if: You begin to severe cramping Your water breaks.  Sometimes it is a big gush of fluid, sometimes it is just a trickle that keeps getting your panties wet or running down your legs You have vaginal bleeding.  It is normal to have a small amount of spotting if your cervix was checked.   [place] [facility]ians/Family Doctors [facility] ([facility]): [address], [phone]           [facility]: [address], [phone]                [facility] ([facility]): [address], [phone] (call to ask if accepting patients) [facility]: [address], [place], [phone]    [place] Pediatricians/Family Doctors [facility] ([facility]): [address], [phone] [facility]: [address], [phone] [facility] [place]: [address], [phone]  [place] [facility]  [facility] ([facility]): [phone] [facility]: [address], [phone]   [place] Family Doctors [facility]: [address], [phone]  [place] [facility]  [facility]: [address], [phone]  Home Blood Pressure Monitoring for Patients   Your provider has recommended that you check your blood pressure (BP) at least once a week at home. If you do not have a blood pressure cuff at home, one will be provided for you. Contact your provider if you have not received your monitor within 1 week.   Helpful Tips for Accurate Home Blood Pressure Checks  Don't smoke, exercise, or drink caffeine 30 minutes before checking your BP Use the restroom before checking your BP (a full bladder can raise your pressure) Relax in a comfortable upright chair Feet on the ground Left arm resting comfortably on a flat surface at the level of your heart Legs uncrossed Back supported Sit quietly and don't talk Place the cuff on your bare arm Adjust snuggly, so that only two fingertips can fit between your skin and the top of the cuff Check 2 readings separated by at least one minute Keep a log of your BP readings For a visual, please reference this diagram: http://[facility].care/bpdiagram  Provider Name: [facility]     Phone: [phone]  Zone 1: ALL CLEAR  Continue to monitor your symptoms:  BP reading is less than 140 (top number) or less than 90 (bottom number)  No right upper stomach pain No headaches or seeing spots No feeling nauseated or throwing up No swelling in face and hands  Zone 2: CAUTION Call your doctor's office for any of the following:  BP reading is greater than 140 (top number) or greater than   90 (bottom number)  Stomach pain under your ribs in the middle or right side Headaches or seeing spots Feeling nauseated or throwing up Swelling in face and hands  Zone 3: EMERGENCY  Seek immediate medical care if you have any of the following:  BP reading is greater than160 (top number) or greater than 110 (bottom number) Severe headaches not improving with Tylenol Serious difficulty catching your breath Any worsening symptoms from  Zone 2     Second Trimester of Pregnancy The second trimester is from week 14 through week 27 (months 4 through 6). The second trimester is often a time when you feel your best. Your body has adjusted to being pregnant, and you begin to feel better physically. Usually, morning sickness has lessened or quit completely, you may have more energy, and you may have an increase in appetite. The second trimester is also a time when the fetus is growing rapidly. At the end of the sixth month, the fetus is about 9 inches long and weighs about 1 pounds. You will likely begin to feel the baby move (quickening) between 16 and 20 weeks of pregnancy. Body changes during your second trimester Your body continues to go through many changes during your second trimester. The changes vary from woman to woman. Your weight will continue to increase. You will notice your lower abdomen bulging out. You may begin to get stretch marks on your hips, abdomen, and breasts. You may develop headaches that can be relieved by medicines. The medicines should be approved by your health care provider. You may urinate more often because the fetus is pressing on your bladder. You may develop or continue to have heartburn as a result of your pregnancy. You may develop constipation because certain hormones are causing the muscles that push waste through your intestines to slow down. You may develop hemorrhoids or swollen, bulging veins (varicose veins). You may have back pain. This is caused by: Weight gain. Pregnancy hormones that are relaxing the joints in your pelvis. A shift in weight and the muscles that support your balance. Your breasts will continue to grow and they will continue to become tender. Your gums may bleed and may be sensitive to brushing and flossing. Dark spots or blotches (chloasma, mask of pregnancy) may develop on your face. This will likely fade after the baby is born. A dark line from your belly button to  the pubic area (linea nigra) may appear. This will likely fade after the baby is born. You may have changes in your hair. These can include thickening of your hair, rapid growth, and changes in texture. Some women also have hair loss during or after pregnancy, or hair that feels dry or thin. Your hair will most likely return to normal after your baby is born.  What to expect at prenatal visits During a routine prenatal visit: You will be weighed to make sure you and the fetus are growing normally. Your blood pressure will be taken. Your abdomen will be measured to track your baby's growth. The fetal heartbeat will be listened to. Any test results from the previous visit will be discussed.  Your health care provider may ask you: How you are feeling. If you are feeling the baby move. If you have had any abnormal symptoms, such as leaking fluid, bleeding, severe headaches, or abdominal cramping. If you are using any tobacco products, including cigarettes, chewing tobacco, and electronic cigarettes. If you have any questions.  Other tests that may be performed during   your second trimester include: Blood tests that check for: Low iron levels (anemia). High blood sugar that affects pregnant women (gestational diabetes) between 24 and 28 weeks. Rh antibodies. This is to check for a protein on red blood cells (Rh factor). Urine tests to check for infections, diabetes, or protein in the urine. An ultrasound to confirm the proper growth and development of the baby. An amniocentesis to check for possible genetic problems. Fetal screens for spina bifida and Down syndrome. HIV (human immunodeficiency virus) testing. Routine prenatal testing includes screening for HIV, unless you choose not to have this test.  Follow these instructions at home: Medicines Follow your health care provider's instructions regarding medicine use. Specific medicines may be either safe or unsafe to take during  pregnancy. Take a prenatal vitamin that contains at least 600 micrograms (mcg) of folic acid. If you develop constipation, try taking a stool softener if your health care provider approves. Eating and drinking Eat a balanced diet that includes fresh fruits and vegetables, whole grains, good sources of protein such as meat, eggs, or tofu, and low-fat dairy. Your health care provider will help you determine the amount of weight gain that is right for you. Avoid raw meat and uncooked cheese. These carry germs that can cause birth defects in the baby. If you have low calcium intake from food, talk to your health care provider about whether you should take a daily calcium supplement. Limit foods that are high in fat and processed sugars, such as fried and sweet foods. To prevent constipation: Drink enough fluid to keep your urine clear or pale yellow. Eat foods that are high in fiber, such as fresh fruits and vegetables, whole grains, and beans. Activity Exercise only as directed by your health care provider. Most women can continue their usual exercise routine during pregnancy. Try to exercise for 30 minutes at least 5 days a week. Stop exercising if you experience uterine contractions. Avoid heavy lifting, wear low heel shoes, and practice good posture. A sexual relationship may be continued unless your health care provider directs you otherwise. Relieving pain and discomfort Wear a good support bra to prevent discomfort from breast tenderness. Take warm sitz baths to soothe any pain or discomfort caused by hemorrhoids. Use hemorrhoid cream if your health care provider approves. Rest with your legs elevated if you have leg cramps or low back pain. If you develop varicose veins, wear support hose. Elevate your feet for 15 minutes, 3-4 times a day. Limit salt in your diet. Prenatal Care Write down your questions. Take them to your prenatal visits. Keep all your prenatal visits as told by your health  care provider. This is important. Safety Wear your seat belt at all times when driving. Make a list of emergency phone numbers, including numbers for family, friends, the hospital, and police and fire departments. General instructions Ask your health care provider for a referral to a local prenatal education class. Begin classes no later than the beginning of month 6 of your pregnancy. Ask for help if you have counseling or nutritional needs during pregnancy. Your health care provider can offer advice or refer you to specialists for help with various needs. Do not use hot tubs, steam rooms, or saunas. Do not douche or use tampons or scented sanitary pads. Do not cross your legs for long periods of time. Avoid cat litter boxes and soil used by cats. These carry germs that can cause birth defects in the baby and possibly loss of the   fetus by miscarriage or stillbirth. Avoid all smoking, herbs, alcohol, and unprescribed drugs. Chemicals in these products can affect the formation and growth of the baby. Do not use any products that contain nicotine or tobacco, such as cigarettes and e-cigarettes. If you need help quitting, ask your health care provider. Visit your dentist if you have not gone yet during your pregnancy. Use a soft toothbrush to brush your teeth and be gentle when you floss. Contact a health care provider if: You have dizziness. You have mild pelvic cramps, pelvic pressure, or nagging pain in the abdominal area. You have persistent nausea, vomiting, or diarrhea. You have a bad smelling vaginal discharge. You have pain when you urinate. Get help right away if: You have a fever. You are leaking fluid from your vagina. You have spotting or bleeding from your vagina. You have severe abdominal cramping or pain. You have rapid weight gain or weight loss. You have shortness of breath with chest pain. You notice sudden or extreme swelling of your face, hands, ankles, feet, or legs. You  have not felt your baby move in over an hour. You have severe headaches that do not go away when you take medicine. You have vision changes. Summary The second trimester is from week 14 through week 27 (months 4 through 6). It is also a time when the fetus is growing rapidly. Your body goes through many changes during pregnancy. The changes vary from woman to woman. Avoid all smoking, herbs, alcohol, and unprescribed drugs. These chemicals affect the formation and growth your baby. Do not use any tobacco products, such as cigarettes, chewing tobacco, and e-cigarettes. If you need help quitting, ask your health care provider. Contact your health care provider if you have any questions. Keep all prenatal visits as told by your health care provider. This is important. This information is not intended to replace advice given to you by your health care provider. Make sure you discuss any questions you have with your health care provider. Document Released: 07/11/2001 Document Revised: 12/23/2015 Document Reviewed: 09/17/2012 Elsevier Interactive Patient Education  2017 [employer].    Considering [place]? Guide for patients at [facility]) Why consider waterbirth? Gentle birth for babies  Less pain medicine used in labor  May allow for passive descent/less pushing  May reduce perineal tears  More mobility and instinctive maternal position changes  Increased maternal relaxation   Is waterbirth safe? What are the risks of infection, drowning or other complications? Infection:  Very low risk (3.7 % for tub vs 4.8% for bed)  7 in 8000 waterbirths with documented infection  Poorly cleaned equipment most common cause  Slightly lower group B strep transmission rate  Drowning  Maternal:  Very low risk  Related to seizures or fainting  Newborn:  Very low risk. No evidence of increased risk of respiratory problems in multiple large studies  Physiological protection  from breathing under water  Avoid underwater birth if there are any fetal complications  Once baby's head is out of the water, keep it out.  Birth complication  Some reports of cord trauma, but risk decreased by bringing baby to surface gradually  No evidence of increased risk of shoulder dystocia. Mothers can usually change positions faster in water than in a bed, possibly aiding the maneuvers to free the shoulder.   There are 2 things you MUST do to have a waterbirth with [facility]: Attend a waterbirth class at [facility]  3rd Wednesday of every month from 7-9 pm (virtual during COVID) [employer] at [email or web address] or [email or web address] or by calling [phone] Bring [place] the certificate from the class to your prenatal appointment or send via MyChart Meet with a midwife at 36 weeks* to see if you can still plan a waterbirth and to sign the consent.   *We also recommend that you schedule as many of your prenatal visits with a midwife as possible.    Helpful information: You may want to bring a bathing suit top to the hospital to wear during labor but this is optional.  All other supplies are provided by the hospital. Please arrive at the hospital with signs of active labor, and do not wait at home until late in labor. It takes 45 min- 2 hours for COVID testing, fetal monitoring, and check in to your room to take place, plus transport and filling of the waterbirth tub.    Things that would prevent you from having a waterbirth: Unknown or Positive COVID-19 diagnosis upon admission to hospital* Premature, <37wks  Previous cesarean birth  Presence of thick meconium-stained fluid  Multiple gestation (Twins, triplets, etc.)  Uncontrolled diabetes or gestational diabetes requiring medication  Hypertension diagnosed in pregnancy or preexisting hypertension (gestational hypertension, preeclampsia, or chronic hypertension) Fetal growth  restriction (your baby measures less than 10th percentile on ultrasound) Heavy vaginal bleeding  Non-reassuring fetal heart rate  Active infection (MRSA, etc.). Group B Strep is NOT a contraindication for waterbirth.  If your labor has to be induced and induction method requires continuous monitoring of the baby's heart rate  Other risks/issues identified by your obstetrical provider   Please remember that birth is unpredictable. Under certain unforeseeable circumstances your provider may advise against giving birth in the tub. These decisions will be made on a case-by-case basis and with the safety of you and your baby as our highest priority.   *Please remember that in order to have a waterbirth, you must test Negative to COVID-19 upon admission to the hospital.  Updated 11/08/20

## 2021-02-04 DIAGNOSIS — M6281 Muscle weakness (generalized): Secondary | ICD-10-CM | POA: Diagnosis not present

## 2021-02-04 DIAGNOSIS — M25562 Pain in left knee: Secondary | ICD-10-CM | POA: Diagnosis not present

## 2021-02-04 DIAGNOSIS — M25662 Stiffness of left knee, not elsewhere classified: Secondary | ICD-10-CM | POA: Diagnosis not present

## 2021-02-04 DIAGNOSIS — R262 Difficulty in walking, not elsewhere classified: Secondary | ICD-10-CM | POA: Diagnosis not present

## 2021-02-10 DIAGNOSIS — M25662 Stiffness of left knee, not elsewhere classified: Secondary | ICD-10-CM | POA: Diagnosis not present

## 2021-02-10 DIAGNOSIS — M25562 Pain in left knee: Secondary | ICD-10-CM | POA: Diagnosis not present

## 2021-02-10 DIAGNOSIS — R262 Difficulty in walking, not elsewhere classified: Secondary | ICD-10-CM | POA: Diagnosis not present

## 2021-02-10 DIAGNOSIS — M6281 Muscle weakness (generalized): Secondary | ICD-10-CM | POA: Diagnosis not present

## 2021-02-11 DIAGNOSIS — F4325 Adjustment disorder with mixed disturbance of emotions and conduct: Secondary | ICD-10-CM | POA: Diagnosis not present

## 2021-02-14 DIAGNOSIS — M25562 Pain in left knee: Secondary | ICD-10-CM | POA: Diagnosis not present

## 2021-02-14 DIAGNOSIS — M25662 Stiffness of left knee, not elsewhere classified: Secondary | ICD-10-CM | POA: Diagnosis not present

## 2021-02-14 DIAGNOSIS — R262 Difficulty in walking, not elsewhere classified: Secondary | ICD-10-CM | POA: Diagnosis not present

## 2021-02-14 DIAGNOSIS — M6281 Muscle weakness (generalized): Secondary | ICD-10-CM | POA: Diagnosis not present

## 2021-02-16 DIAGNOSIS — F4325 Adjustment disorder with mixed disturbance of emotions and conduct: Secondary | ICD-10-CM | POA: Diagnosis not present

## 2021-02-21 DIAGNOSIS — M25662 Stiffness of left knee, not elsewhere classified: Secondary | ICD-10-CM | POA: Diagnosis not present

## 2021-02-21 DIAGNOSIS — M25562 Pain in left knee: Secondary | ICD-10-CM | POA: Diagnosis not present

## 2021-02-21 DIAGNOSIS — F4325 Adjustment disorder with mixed disturbance of emotions and conduct: Secondary | ICD-10-CM | POA: Diagnosis not present

## 2021-02-21 DIAGNOSIS — M6281 Muscle weakness (generalized): Secondary | ICD-10-CM | POA: Diagnosis not present

## 2021-02-21 DIAGNOSIS — R262 Difficulty in walking, not elsewhere classified: Secondary | ICD-10-CM | POA: Diagnosis not present

## 2021-02-23 DIAGNOSIS — F4325 Adjustment disorder with mixed disturbance of emotions and conduct: Secondary | ICD-10-CM | POA: Diagnosis not present

## 2021-02-24 DIAGNOSIS — F4325 Adjustment disorder with mixed disturbance of emotions and conduct: Secondary | ICD-10-CM | POA: Diagnosis not present

## 2021-02-25 IMAGING — CR DG KNEE COMPLETE 4+V*L*
4 series · 4 of 4 positions shown · non-contrast
Comparison: None.

CLINICAL DATA: Fall yesterday on trampoline. Knee pain and
inability to bear weight. Initial encounter.

EXAM:
LEFT KNEE - COMPLETE 4+ VIEW

[t knee ap left]
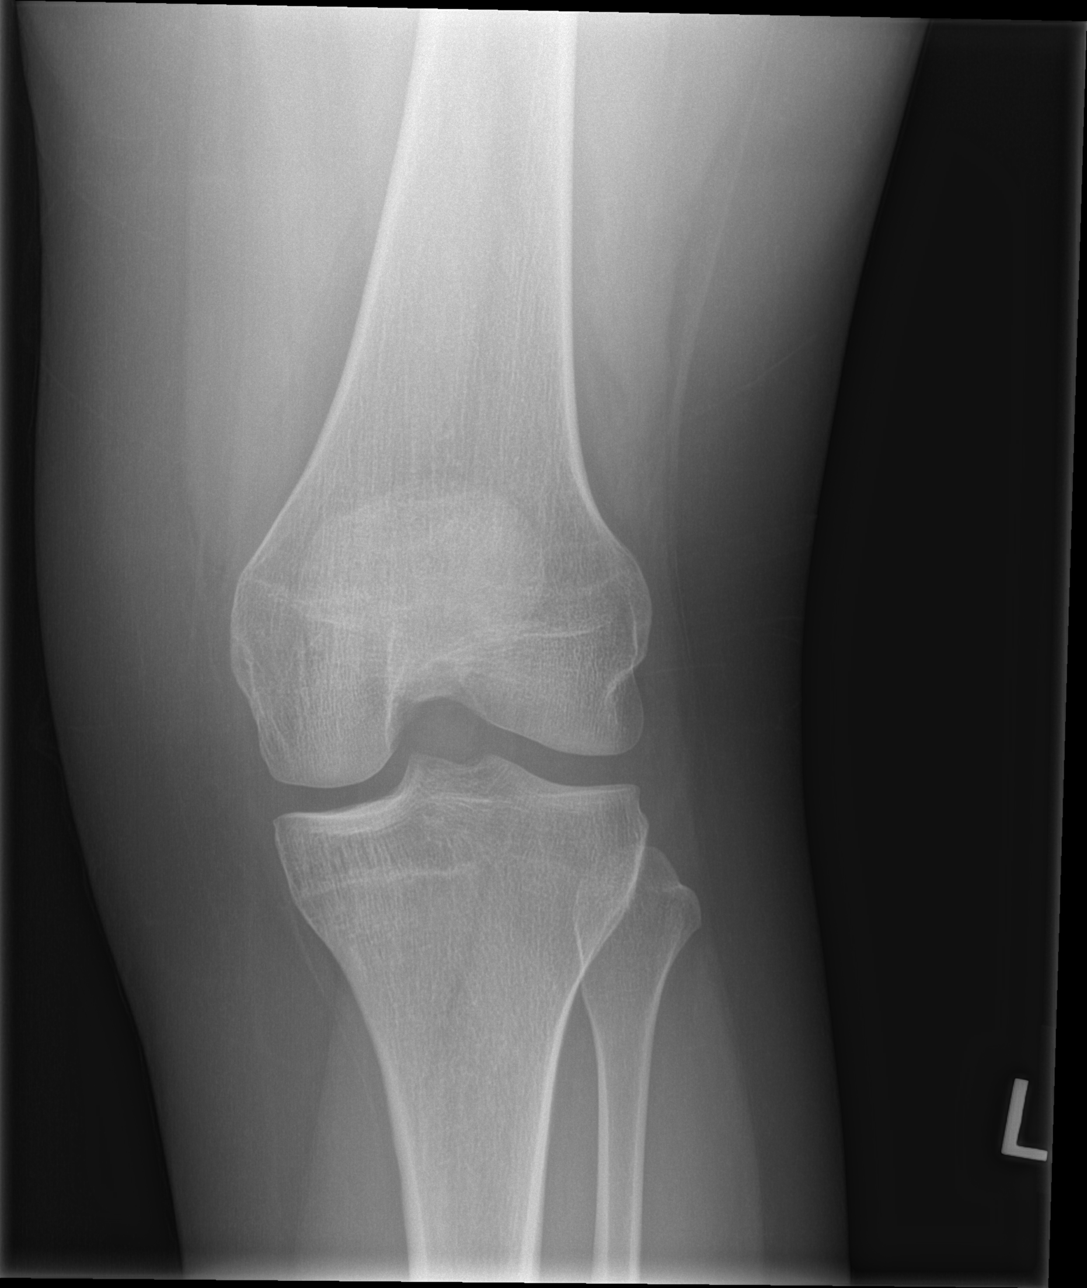

[t knee obl left]
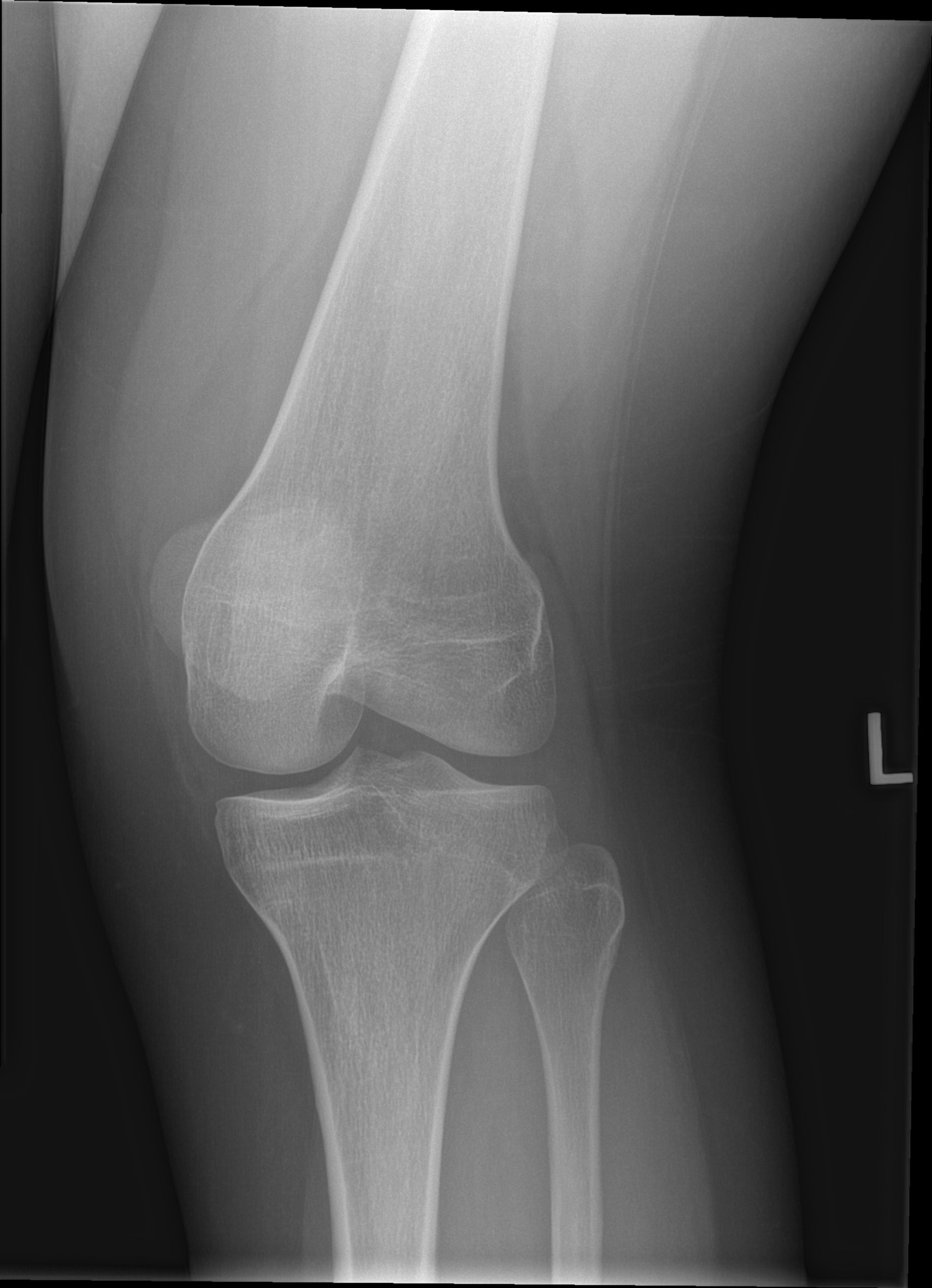

[t knee lat left (1 of 2)]
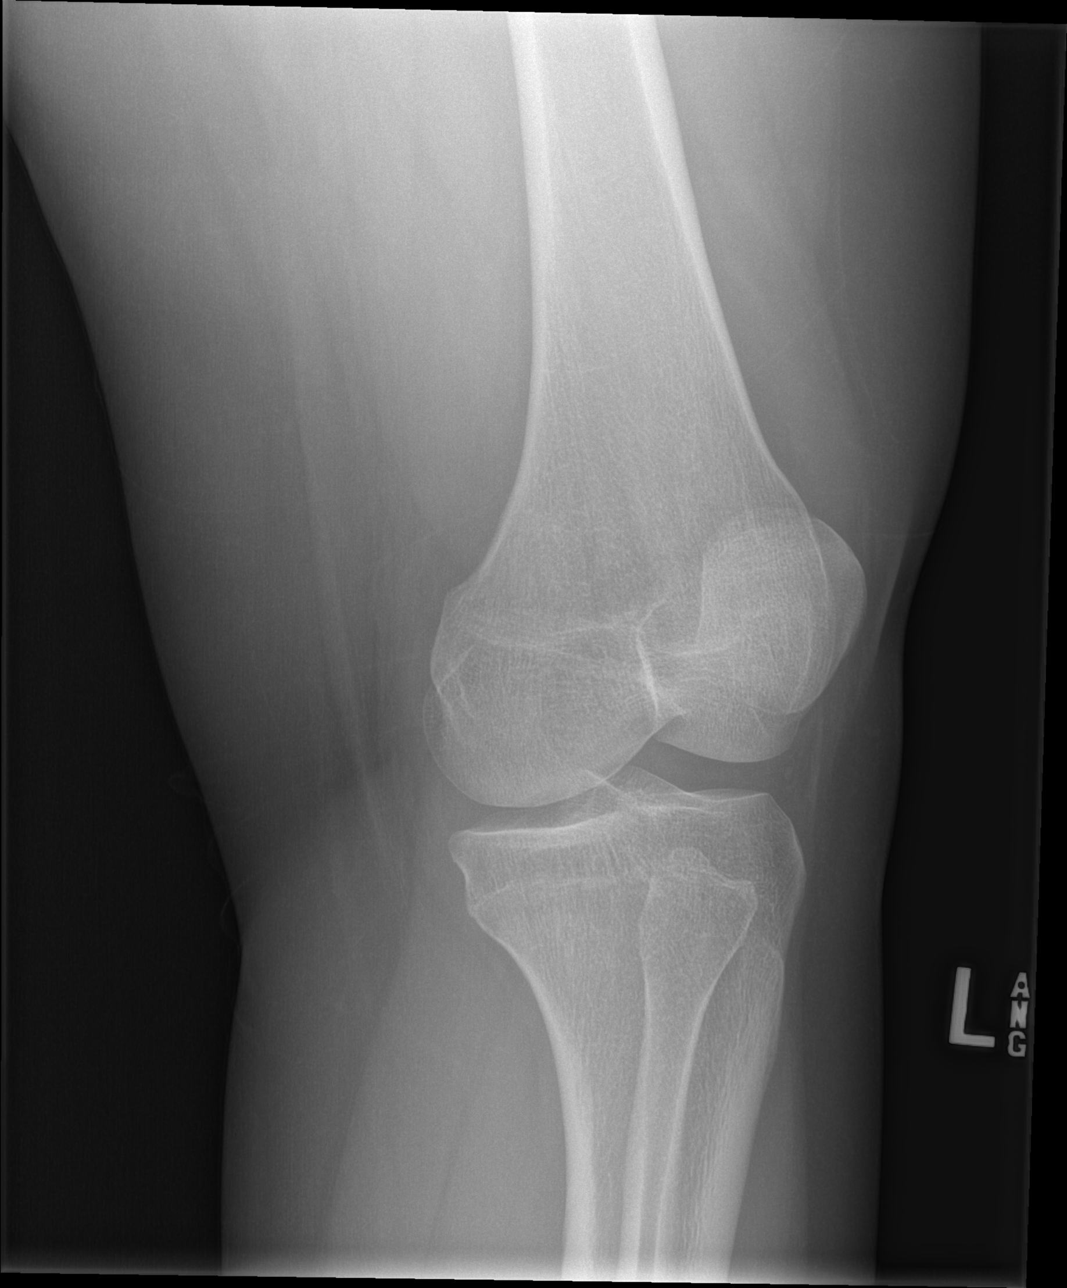

[t knee lat left (2 of 2)]
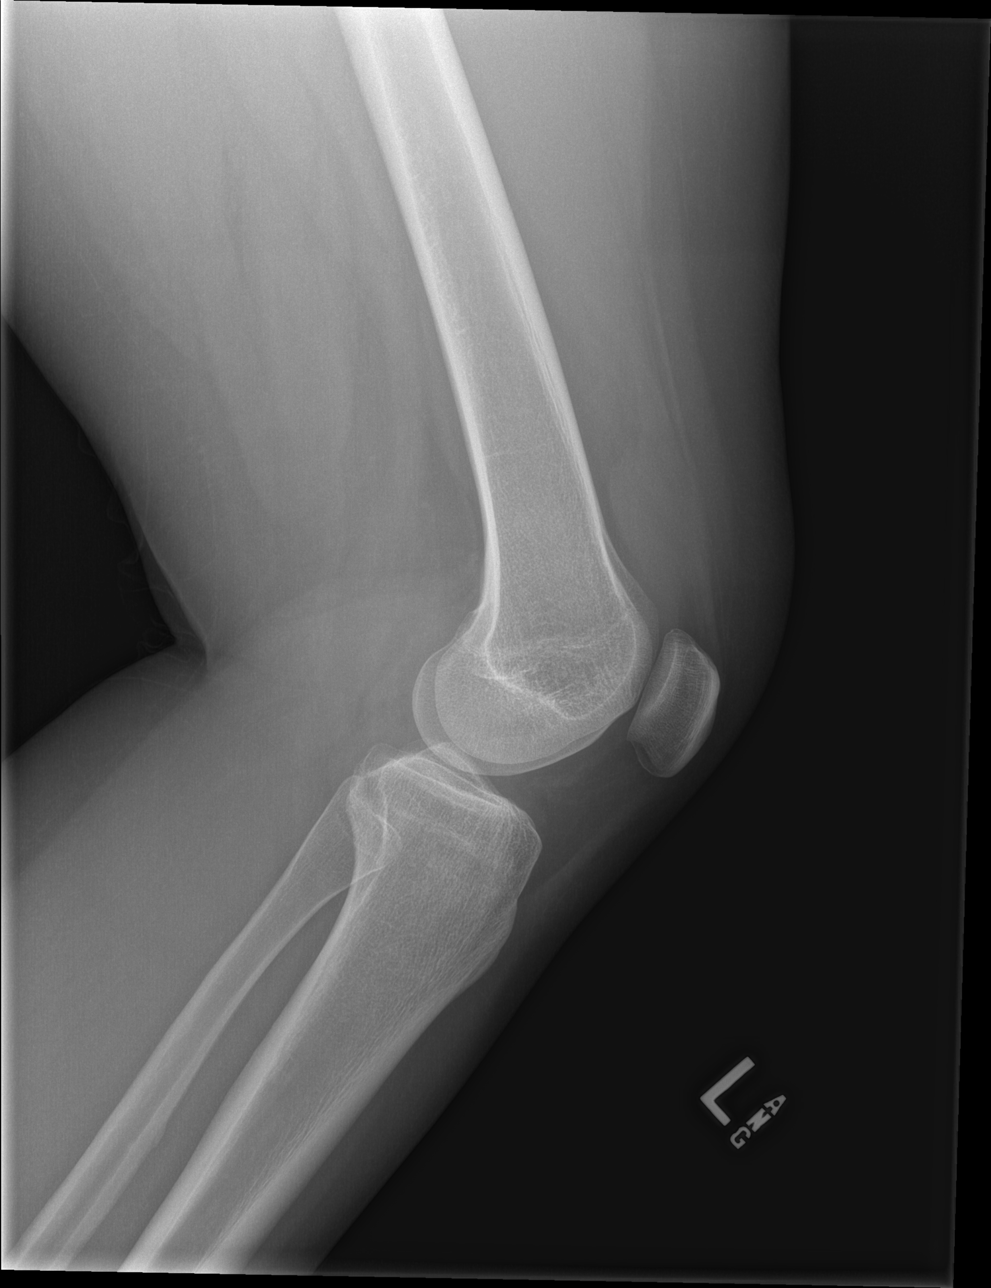

[4 of 4 positions shown; findings below may reference images not displayed]

FINDINGS: No evidence of fracture or dislocation. A moderate knee joint
effusion is seen. No evidence of arthropathy or other focal bone
abnormality. Soft tissues are unremarkable.
IMPRESSION: Moderate knee joint effusion. No evidence of fracture or
dislocation.

## 2021-03-01 ENCOUNTER — Encounter: Payer: Self-pay | Admitting: Women's Health

## 2021-03-01 ENCOUNTER — Ambulatory Visit (INDEPENDENT_AMBULATORY_CARE_PROVIDER_SITE_OTHER): Payer: BC Managed Care – PPO | Admitting: Women's Health

## 2021-03-01 ENCOUNTER — Other Ambulatory Visit: Payer: Self-pay

## 2021-03-01 VITALS — BP 123/72 | HR 94 | Wt 210.6 lb

## 2021-03-01 DIAGNOSIS — Z3482 Encounter for supervision of other normal pregnancy, second trimester: Secondary | ICD-10-CM

## 2021-03-01 DIAGNOSIS — Z3A24 24 weeks gestation of pregnancy: Secondary | ICD-10-CM

## 2021-03-01 NOTE — Progress Notes (Signed)
    LOW-RISK PREGNANCY VISIT Patient name: Sarah Nichols MRN [id number]  Date of birth: August 11, 1995 Chief Complaint:   Routine Prenatal Visit  History of Present Illness:   Sarah Nichols is a 25 y.o. G23P0020 female at [redacted]w[redacted]d with an Estimated Date of Delivery: 06/19/21 being seen today for ongoing management of a low-risk pregnancy.   Today she reports no complaints. Contractions: Not present. Vag. Bleeding: None.  Movement: Present. denies leaking of fluid.  Depression screen PHQ 2/9 12/08/2020  Decreased Interest 1  Down, Depressed, Hopeless 1  PHQ - 2 Score 2  Altered sleeping 2  Tired, decreased energy 3  Change in appetite 1  Feeling bad or failure about yourself  1  Trouble concentrating 2  Moving slowly or fidgety/restless 3  Suicidal thoughts 0  PHQ-9 Score 14     GAD 7 : Generalized Anxiety Score 12/08/2020  Nervous, Anxious, on Edge 3  Control/stop worrying 2  Worry too much - different things 2  Trouble relaxing 1  Restless 2  Easily annoyed or irritable 2  Afraid - awful might happen 2  Total GAD 7 Score 14      Review of Systems:   Pertinent items are noted in HPI Denies abnormal vaginal discharge w/ itching/odor/irritation, headaches, visual changes, shortness of breath, chest pain, abdominal pain, severe nausea/vomiting, or problems with urination or bowel movements unless otherwise stated above. Pertinent History Reviewed:  Reviewed past medical,surgical, social, obstetrical and family history.  Reviewed problem list, medications and allergies. Physical Assessment:   Vitals:   03/01/21 0841  BP: 123/72  Pulse: 94  Weight: 210 lb 9.6 oz (95.5 kg)  Body mass index is 36.15 kg/m.        Physical Examination:   General appearance: Well appearing, and in no distress  Mental status: Alert, oriented to person, place, and time  Skin: Warm & dry  Cardiovascular: Normal heart rate noted  Respiratory: Normal respiratory effort, no  distress  Abdomen: Soft, gravid, nontender  Pelvic: Cervical exam deferred         Extremities: Edema: None  Fetal Status: Fetal Heart Rate (bpm): 147 Fundal Height: 25 cm Movement: Present    Chaperone: N/A   No results found for this or any previous visit (from the past 24 hour(s)).  Assessment & Plan:  1) Low-risk pregnancy G3P0020 at [redacted]w[redacted]d with an Estimated Date of Delivery: 06/19/21   2) Interested in waterbirth, signed up for this month's class   Meds: No orders of the defined types were placed in this encounter.  Labs/procedures today: none  Plan:  Continue routine obstetrical care  Next visit: prefers will be in person for pn2     Reviewed: Preterm labor symptoms and general obstetric precautions including but not limited to vaginal bleeding, contractions, leaking of fluid and fetal movement were reviewed in detail with the patient.  All questions were answered. Does have home bp cuff. Office bp cuff given: not applicable. Check bp weekly, let us know if consistently >140 and/or >90.  Follow-up: Return in about 4 weeks (around 03/29/2021) for LROB, PN2, CNM, in person.  No future appointments.  No orders of the defined types were placed in this encounter.  Cheral Marker CNM, [facility] 03/01/2021 9:10 AM

## 2021-03-01 NOTE — Patient Instructions (Signed)
Sarah Nichols, thank you for choosing our office today! We appreciate the opportunity to meet your healthcare needs. You may receive a short survey by mail, e-mail, or through MyChart. If you are happy with your care we would appreciate if you could take just a few minutes to complete the survey questions. We read all of your comments and take your feedback very seriously. Thank you again for choosing our office.  [facility] ([address] [place], [place] [address]) Entrance C, located off of [address] Free 24/7 valet parking   You will have your sugar test next visit.  Please do not eat or drink anything after midnight the night before you come, not even water.  You will be here for at least two hours.  Please make an appointment online for the bloodwork at [email or web address] for 8:00am (or as close to this as possible). Make sure you select the [address] service center.   CLASSES: Go to [email or web address] to register for classes (childbirth, breastfeeding, waterbirth, infant CPR, daddy bootcamp, etc.)  Call the office ([phone]) or go to [facility] if: You begin to have strong, frequent contractions Your water breaks.  Sometimes it is a big gush of fluid, sometimes it is just a trickle that keeps getting your panties wet or running down your legs You have vaginal bleeding.  It is normal to have a small amount of spotting if your cervix was checked.  You don't feel your baby moving like normal.  If you don't, get you something to eat and drink and lay down and focus on feeling your baby move.   If your baby is still not moving like normal, you should call the office or go to [facility].  Call the office ([phone]) or go to [facility] for these signs of pre-eclampsia: Severe headache that does not go away with Tylenol Visual changes- seeing spots, double, blurred vision Pain under your right breast or upper  abdomen that does not go away with Tums or heartburn medicine Nausea and/or vomiting Severe swelling in your hands, feet, and face    [place] Pediatric[facility]/Family Doctors [facility] ([facility]): [address], [phone]           [facility]: [address], [phone]                [facility] ([facility]): [address], [phone] (call to ask if accepting patients) [facility]: [address], [place], [phone]    [place] [facility]/Family Doctors [facility] ([facility]): [address], [phone] [facility]: [address], [phone] [facility]: [address], [phone]  [place] [facility]  [facility] ([facility]): [phone] [facility]: [address], [phone]   [place] [facility]: [address], [phone]  [facility]  [facility]: [phone], [phone]  Home Blood Pressure Monitoring for Patients   Your provider has recommended that you check your blood pressure (BP) at least once a week at home. If you do not have a blood pressure cuff at home, one will be provided for you. Contact your provider if you have not received your monitor within 1 week.   Helpful Tips for Accurate Home Blood Pressure Checks  Don't smoke, exercise, or drink   caffeine 30 minutes before checking your BP Use the restroom before checking your BP (a full bladder can raise your pressure) Relax in a comfortable upright chair Feet on the ground Left arm resting comfortably on a flat surface at the level of your heart Legs uncrossed Back supported Sit quietly and don't talk Place the cuff on your bare arm Adjust snuggly, so that only two fingertips can fit between your skin and the top of the cuff Check 2  readings separated by at least one minute Keep a log of your BP readings For a visual, please reference this diagram: http://[facility].care/bpdiagram  Provider Name: [facility]     Phone: [phone]  Zone 1: ALL CLEAR  Continue to monitor your symptoms:  BP reading is less than 140 (top number) or less than 90 (bottom number)  No right upper stomach pain No headaches or seeing spots No feeling nauseated or throwing up No swelling in face and hands  Zone 2: CAUTION Call your doctor's office for any of the following:  BP reading is greater than 140 (top number) or greater than 90 (bottom number)  Stomach pain under your ribs in the middle or right side Headaches or seeing spots Feeling nauseated or throwing up Swelling in face and hands  Zone 3: EMERGENCY  Seek immediate medical care if you have any of the following:  BP reading is greater than160 (top number) or greater than 110 (bottom number) Severe headaches not improving with Tylenol Serious difficulty catching your breath Any worsening symptoms from Zone 2   Second Trimester of Pregnancy The second trimester is from week 13 through week 28, months 4 through 6. The second trimester is often a time when you feel your best. Your body has also adjusted to being pregnant, and you begin to feel better physically. Usually, morning sickness has lessened or quit completely, you may have more energy, and you may have an increase in appetite. The second trimester is also a time when the fetus is growing rapidly. At the end of the sixth month, the fetus is about 9 inches long and weighs about 1 pounds. You will likely begin to feel the baby move (quickening) between 18 and 20 weeks of the pregnancy. BODY CHANGES Your body goes through many changes during pregnancy. The changes vary from woman to woman.  Your weight will continue to increase. You will notice your lower abdomen bulging out. You may begin to get stretch marks on your  hips, abdomen, and breasts. You may develop headaches that can be relieved by medicines approved by your health care provider. You may urinate more often because the fetus is pressing on your bladder. You may develop or continue to have heartburn as a result of your pregnancy. You may develop constipation because certain hormones are causing the muscles that push waste through your intestines to slow down. You may develop hemorrhoids or swollen, bulging veins (varicose veins). You may have back pain because of the weight gain and pregnancy hormones relaxing your joints between the bones in your pelvis and as a result of a shift in weight and the muscles that support your balance. Your breasts will continue to grow and be tender. Your gums may bleed and may be sensitive to brushing and flossing. Dark spots or blotches (chloasma, mask of pregnancy) may develop on your face. This will likely fade after the baby is born. A dark line from your belly button to the pubic area (linea nigra) may appear. This  will likely fade after the baby is born. You may have changes in your hair. These can include thickening of your hair, rapid growth, and changes in texture. Some women also have hair loss during or after pregnancy, or hair that feels dry or thin. Your hair will most likely return to normal after your baby is born. WHAT TO EXPECT AT YOUR PRENATAL VISITS During a routine prenatal visit: You will be weighed to make sure you and the fetus are growing normally. Your blood pressure will be taken. Your abdomen will be measured to track your baby's growth. The fetal heartbeat will be listened to. Any test results from the previous visit will be discussed. Your health care provider may ask you: How you are feeling. If you are feeling the baby move. If you have had any abnormal symptoms, such as leaking fluid, bleeding, severe headaches, or abdominal cramping. If you have any questions. Other tests that may  be performed during your second trimester include: Blood tests that check for: Low iron levels (anemia). Gestational diabetes (between 24 and 28 weeks). Rh antibodies. Urine tests to check for infections, diabetes, or protein in the urine. An ultrasound to confirm the proper growth and development of the baby. An amniocentesis to check for possible genetic problems. Fetal screens for spina bifida and Down syndrome. HOME CARE INSTRUCTIONS  Avoid all smoking, herbs, alcohol, and unprescribed drugs. These chemicals affect the formation and growth of the baby. Follow your health care provider's instructions regarding medicine use. There are medicines that are either safe or unsafe to take during pregnancy. Exercise only as directed by your health care provider. Experiencing uterine cramps is a good sign to stop exercising. Continue to eat regular, healthy meals. Wear a good support bra for breast tenderness. Do not use hot tubs, steam rooms, or saunas. Wear your seat belt at all times when driving. Avoid raw meat, uncooked cheese, cat litter boxes, and soil used by cats. These carry germs that can cause birth defects in the baby. Take your prenatal vitamins. Try taking a stool softener (if your health care provider approves) if you develop constipation. Eat more high-fiber foods, such as fresh vegetables or fruit and whole grains. Drink plenty of fluids to keep your urine clear or pale yellow. Take warm sitz baths to soothe any pain or discomfort caused by hemorrhoids. Use hemorrhoid cream if your health care provider approves. If you develop varicose veins, wear support hose. Elevate your feet for 15 minutes, 3-4 times a day. Limit salt in your diet. Avoid heavy lifting, wear low heel shoes, and practice good posture. Rest with your legs elevated if you have leg cramps or low back pain. Visit your dentist if you have not gone yet during your pregnancy. Use a soft toothbrush to brush your teeth  and be gentle when you floss. A sexual relationship may be continued unless your health care provider directs you otherwise. Continue to go to all your prenatal visits as directed by your health care provider. SEEK MEDICAL CARE IF:  You have dizziness. You have mild pelvic cramps, pelvic pressure, or nagging pain in the abdominal area. You have persistent nausea, vomiting, or diarrhea. You have a bad smelling vaginal discharge. You have pain with urination. SEEK IMMEDIATE MEDICAL CARE IF:  You have a fever. You are leaking fluid from your vagina. You have spotting or bleeding from your vagina. You have severe abdominal cramping or pain. You have rapid weight gain or loss. You have shortness of   breath with chest pain. You notice sudden or extreme swelling of your face, hands, ankles, feet, or legs. You have not felt your baby move in over an hour. You have severe headaches that do not go away with medicine. You have vision changes. Document Released: 07/11/2001 Document Revised: 07/22/2013 Document Reviewed: 09/17/2012 [facility] Patient Information 2015 [place], [place]. This information is not intended to replace advice given to you by your health care provider. Make sure you discuss any questions you have with your health care provider.

## 2021-03-02 DIAGNOSIS — F4325 Adjustment disorder with mixed disturbance of emotions and conduct: Secondary | ICD-10-CM | POA: Diagnosis not present

## 2021-03-04 DIAGNOSIS — F4325 Adjustment disorder with mixed disturbance of emotions and conduct: Secondary | ICD-10-CM | POA: Diagnosis not present

## 2021-03-24 DIAGNOSIS — F4325 Adjustment disorder with mixed disturbance of emotions and conduct: Secondary | ICD-10-CM | POA: Diagnosis not present

## 2021-03-28 DIAGNOSIS — F4325 Adjustment disorder with mixed disturbance of emotions and conduct: Secondary | ICD-10-CM | POA: Diagnosis not present

## 2021-03-29 ENCOUNTER — Other Ambulatory Visit: Payer: Self-pay

## 2021-03-29 ENCOUNTER — Encounter: Payer: Self-pay | Admitting: Women's Health

## 2021-03-29 ENCOUNTER — Other Ambulatory Visit: Payer: BC Managed Care – PPO

## 2021-03-29 ENCOUNTER — Ambulatory Visit (INDEPENDENT_AMBULATORY_CARE_PROVIDER_SITE_OTHER): Payer: BC Managed Care – PPO | Admitting: Women's Health

## 2021-03-29 VITALS — BP 124/83 | HR 90 | Wt 215.0 lb

## 2021-03-29 DIAGNOSIS — F419 Anxiety disorder, unspecified: Secondary | ICD-10-CM

## 2021-03-29 DIAGNOSIS — F418 Other specified anxiety disorders: Secondary | ICD-10-CM

## 2021-03-29 DIAGNOSIS — Z3483 Encounter for supervision of other normal pregnancy, third trimester: Secondary | ICD-10-CM

## 2021-03-29 DIAGNOSIS — F32A Depression, unspecified: Secondary | ICD-10-CM

## 2021-03-29 DIAGNOSIS — Z23 Encounter for immunization: Secondary | ICD-10-CM | POA: Diagnosis not present

## 2021-03-29 DIAGNOSIS — Z3A28 28 weeks gestation of pregnancy: Secondary | ICD-10-CM

## 2021-03-29 MED ORDER — SERTRALINE HCL 100 MG PO TABS: 100.0000 mg | ORAL_TABLET | Freq: Every day | ORAL | 3 refills | Status: DC

## 2021-03-29 NOTE — Patient Instructions (Signed)
Sarah Nichols, thank you for choosing our office today! We appreciate the opportunity to meet your healthcare needs. You may receive a short survey by mail, e-mail, or through [employer]. If you are happy with your care we would appreciate if you could take just a few minutes to complete the survey questions. We read all of your comments and take your feedback very seriously. Thank you again for choosing our office.  Center for [employer] Team at [facility]  [facility] at [facility] ([address] [place], [place] [address]) Entrance C, located off of [address] Free 24/7 valet parking   CLASSES: Go to [email or web address] to register for classes (childbirth, breastfeeding, waterbirth, infant CPR, daddy bootcamp, etc.)  Call the office [phone]) or go to [facility] if: You begin to have strong, frequent contractions Your water breaks.  Sometimes it is a big gush of fluid, sometimes it is just a trickle that keeps getting your panties wet or running down your legs You have vaginal bleeding.  It is normal to have a small amount of spotting if your cervix was checked.  You don't feel your baby moving like normal.  If you don't, get you something to eat and drink and lay down and focus on feeling your baby move.   If your baby is still not moving like normal, you should call the office or go to [facility] ([facility]).  Call the office [phone]) or go to [facility] hospital for these signs of pre-eclampsia: Severe headache that does not go away with Tylenol Visual changes- seeing spots, double, blurred vision Pain under your right breast or upper abdomen that does not go away with Tums or heartburn medicine Nausea and/or vomiting Severe swelling in your hands, feet, and face   Tdap Vaccine It is recommended that you get the Tdap vaccine during the third trimester of EACH pregnancy to help protect your baby from getting pertussis (whooping cough) 27-36 weeks is the BEST time to do  this so that you can pass the protection on to your baby. During pregnancy is better than after pregnancy, but if you are unable to get it during pregnancy it will be offered at the hospital.  You can get this vaccine with [place], at the health department, your family doctor, or some local pharmacies Everyone who will be around your baby should also be up-to-date on their vaccines before the baby comes. Adults (who are not pregnant) only need 1 dose of Tdap during adulthood.   [facility] Pediatricians/Family Doctors [facility]): [address] Dr. Colette Ribas, [phone]           [facility]: [address], [phone]                [facility] Medicine [facility]): [address], [phone] (call to ask if accepting patients) [facility] Department: [address], [place], [phone]    [facility] Pediatricians/Family Doctors [facility]): [phone], [phone] [facility]: [address] [place], [phone] [facility]: [address], [phone]  [facility] Doctors  [facility]): [phone] [facility]: [address], [phone]   [facility] Doctors [facility]: [address] [phone]  [facility] Family Doctors  [facility]: [phone], [phone]  Home Blood Pressure Monitoring for Patients   Your provider has recommended that you check your  blood pressure (BP) at least once a week at home. If you do not have a blood pressure cuff at home, one will be provided for you. Contact your provider if you have not received your monitor within 1 week.   Helpful Tips for Accurate Home Blood Pressure Checks  Don't smoke, exercise, or drink caffeine 30 minutes before checking your BP Use the restroom before checking your BP (a full bladder can raise your  pressure) Relax in a comfortable upright chair Feet on the ground Left arm resting comfortably on a flat surface at the level of your heart Legs uncrossed Back supported Sit quietly and don't talk Place the cuff on your bare arm Adjust snuggly, so that only two fingertips can fit between your skin and the top of the cuff Check 2 readings separated by at least one minute Keep a log of your BP readings For a visual, please reference this diagram: http://ccnc.care/bpdiagram  Provider Name: [facility]     Phone: [phone]  Zone 1: ALL CLEAR  Continue to monitor your symptoms:  BP reading is less than 140 (top number) or less than 90 (bottom number)  No right upper stomach pain No headaches or seeing spots No feeling nauseated or throwing up No swelling in face and hands  Zone 2: CAUTION Call your doctor's office for any of the following:  BP reading is greater than 140 (top number) or greater than 90 (bottom number)  Stomach pain under your ribs in the middle or right side Headaches or seeing spots Feeling nauseated or throwing up Swelling in face and hands  Zone 3: EMERGENCY  Seek immediate medical care if you have any of the following:  BP reading is greater than160 (top number) or greater than 110 (bottom number) Severe headaches not improving with Tylenol Serious difficulty catching your breath Any worsening symptoms from Zone 2   Third Trimester of Pregnancy The third trimester is from week 29 through week 42, months 7 through 9. The third trimester is a time when the fetus is growing rapidly. At the end of the ninth month, the fetus is about 20 inches in length and weighs 6-10 pounds.  BODY CHANGES Your body goes through many changes during pregnancy. The changes vary from woman to woman.  Your weight will continue to increase. You can expect to gain 25-35 pounds (11-16 kg) by the end of the pregnancy. You may begin to get stretch marks on your hips, abdomen,  and breasts. You may urinate more often because the fetus is moving lower into your pelvis and pressing on your bladder. You may develop or continue to have heartburn as a result of your pregnancy. You may develop constipation because certain hormones are causing the muscles that push waste through your intestines to slow down. You may develop hemorrhoids or swollen, bulging veins (varicose veins). You may have pelvic pain because of the weight gain and pregnancy hormones relaxing your joints between the bones in your pelvis. Backaches may result from overexertion of the muscles supporting your posture. You may have changes in your hair. These can include thickening of your hair, rapid growth, and changes in texture. Some women also have hair loss during or after pregnancy, or hair that feels dry or thin. Your hair will most likely return to normal after your baby is born. Your breasts will continue to grow and be tender. A yellow discharge may leak from your breasts called colostrum. Your belly button may stick out. You may   feel short of breath because of your expanding uterus. You may notice the fetus "dropping," or moving lower in your abdomen. You may have a bloody mucus discharge. This usually occurs a few days to a week before labor begins. Your cervix becomes thin and soft (effaced) near your due date. WHAT TO EXPECT AT YOUR PRENATAL EXAMS  You will have prenatal exams every 2 weeks until week 36. Then, you will have weekly prenatal exams. During a routine prenatal visit: You will be weighed to make sure you and the fetus are growing normally. Your blood pressure is taken. Your abdomen will be measured to track your baby's growth. The fetal heartbeat will be listened to. Any test results from the previous visit will be discussed. You may have a cervical check near your due date to see if you have effaced. At around 36 weeks, your caregiver will check your cervix. At the same time, your  caregiver will also perform a test on the secretions of the vaginal tissue. This test is to determine if a type of bacteria, Group B streptococcus, is present. Your caregiver will explain this further. Your caregiver may ask you: What your birth plan is. How you are feeling. If you are feeling the baby move. If you have had any abnormal symptoms, such as leaking fluid, bleeding, severe headaches, or abdominal cramping. If you have any questions. Other tests or screenings that may be performed during your third trimester include: Blood tests that check for low iron levels (anemia). Fetal testing to check the health, activity level, and growth of the fetus. Testing is done if you have certain medical conditions or if there are problems during the pregnancy. FALSE LABOR You may feel small, irregular contractions that eventually go away. These are called Braxton Hicks contractions, or false labor. Contractions may last for hours, days, or even weeks before true labor sets in. If contractions come at regular intervals, intensify, or become painful, it is best to be seen by your caregiver.  SIGNS OF LABOR  Menstrual-like cramps. Contractions that are 5 minutes apart or less. Contractions that start on the top of the uterus and spread down to the lower abdomen and back. A sense of increased pelvic pressure or back pain. A watery or bloody mucus discharge that comes from the vagina. If you have any of these signs before the 37th week of pregnancy, call your caregiver right away. You need to go to the hospital to get checked immediately. HOME CARE INSTRUCTIONS  Avoid all smoking, herbs, alcohol, and unprescribed drugs. These chemicals affect the formation and growth of the baby. Follow your caregiver's instructions regarding medicine use. There are medicines that are either safe or unsafe to take during pregnancy. Exercise only as directed by your caregiver. Experiencing uterine cramps is a good sign to  stop exercising. Continue to eat regular, healthy meals. Wear a good support bra for breast tenderness. Do not use hot tubs, steam rooms, or saunas. Wear your seat belt at all times when driving. Avoid raw meat, uncooked cheese, cat litter boxes, and soil used by cats. These carry germs that can cause birth defects in the baby. Take your prenatal vitamins. Try taking a stool softener (if your caregiver approves) if you develop constipation. Eat more high-fiber foods, such as fresh vegetables or fruit and whole grains. Drink plenty of fluids to keep your urine clear or pale yellow. Take warm sitz baths to soothe any pain or discomfort caused by hemorrhoids. Use hemorrhoid cream if   your caregiver approves. If you develop varicose veins, wear support hose. Elevate your feet for 15 minutes, 3-4 times a day. Limit salt in your diet. Avoid heavy lifting, wear low heal shoes, and practice good posture. Rest a lot with your legs elevated if you have leg cramps or low back pain. Visit your dentist if you have not gone during your pregnancy. Use a soft toothbrush to brush your teeth and be gentle when you floss. A sexual relationship may be continued unless your caregiver directs you otherwise. Do not travel far distances unless it is absolutely necessary and only with the approval of your caregiver. Take prenatal classes to understand, practice, and ask questions about the labor and delivery. Make a trial run to the hospital. Pack your hospital bag. Prepare the baby's nursery. Continue to go to all your prenatal visits as directed by your caregiver. SEEK MEDICAL CARE IF: You are unsure if you are in labor or if your water has broken. You have dizziness. You have mild pelvic cramps, pelvic pressure, or nagging pain in your abdominal area. You have persistent nausea, vomiting, or diarrhea. You have a bad smelling vaginal discharge. You have pain with urination. SEEK IMMEDIATE MEDICAL CARE IF:  You  have a fever. You are leaking fluid from your vagina. You have spotting or bleeding from your vagina. You have severe abdominal cramping or pain. You have rapid weight loss or gain. You have shortness of breath with chest pain. You notice sudden or extreme swelling of your face, hands, ankles, feet, or legs. You have not felt your baby move in over an hour. You have severe headaches that do not go away with medicine. You have vision changes. Document Released: 07/11/2001 Document Revised: 07/22/2013 Document Reviewed: 09/17/2012 [facility] Patient Information 2015 [place], [place]. This information is not intended to replace advice given to you by your health care provider. Make sure you discuss any questions you have with your health care provider.   Considering Waterbirth? Guide for patients at Center for [employer] [facility]) Why consider waterbirth? Gentle birth for babies  Less pain medicine used in labor  May allow for passive descent/less pushing  May reduce perineal tears  More mobility and instinctive maternal position changes  Increased maternal relaxation   Is waterbirth safe? What are the risks of infection, drowning or other complications? Infection:  Very low risk (3.7 % for tub vs 4.8% for bed)  7 in 8000 waterbirths with documented infection  Poorly cleaned equipment most common cause  Slightly lower group B strep transmission rate  Drowning  Maternal:  Very low risk  Related to seizures or fainting  Newborn:  Very low risk. No evidence of increased risk of respiratory problems in multiple large studies  Physiological protection from breathing under water  Avoid underwater birth if there are any fetal complications  Once baby's head is out of the water, keep it out.  Birth complication  Some reports of cord trauma, but risk decreased by bringing baby to surface gradually  No evidence of increased risk of shoulder dystocia. Mothers can usually change positions  faster in water than in a bed, possibly aiding the maneuvers to free the shoulder.   There are 2 things you MUST do to have a waterbirth with [facility]: Attend a waterbirth class at [employer] [facility] at [facility]   3rd Wednesday of every month from 7-9 pm (virtual during COVID) [employer] at [email or web address] or [email or web address] or by calling [phone] Bring [place]  the certificate from the class to your prenatal appointment or send via MyChart Meet with a midwife at 36 weeks* to see if you can still plan a waterbirth and to sign the consent.   *We also recommend that you schedule as many of your prenatal visits with a midwife as possible.    Helpful information: You may want to bring a bathing suit top to the hospital to wear during labor but this is optional.  All other supplies are provided by the hospital. Please arrive at the hospital with signs of active labor, and do not wait at home until late in labor. It takes 45 min- 2 hours for COVID testing, fetal monitoring, and check in to your room to take place, plus transport and filling of the waterbirth tub.    Things that would prevent you from having a waterbirth: Unknown or Positive COVID-19 diagnosis upon admission to hospital* Premature, <37wks  Previous cesarean birth  Presence of thick meconium-stained fluid  Multiple gestation (Twins, triplets, etc.)  Uncontrolled diabetes or gestational diabetes requiring medication  Hypertension diagnosed in pregnancy or preexisting hypertension (gestational hypertension, preeclampsia, or chronic hypertension) Fetal growth restriction (your baby measures less than 10th percentile on ultrasound) Heavy vaginal bleeding  Non-reassuring fetal heart rate  Active infection (MRSA, etc.). Group B Strep is NOT a contraindication for waterbirth.  If your labor has to be induced and induction method requires continuous monitoring of the baby's heart rate  Other risks/issues  identified by your obstetrical provider   Please remember that birth is unpredictable. Under certain unforeseeable circumstances your provider may advise against giving birth in the tub. These decisions will be made on a case-by-case basis and with the safety of you and your baby as our highest priority.   *Please remember that in order to have a waterbirth, you must test Negative to COVID-19 upon admission to the hospital.  Updated 11/08/20

## 2021-03-29 NOTE — Progress Notes (Signed)
LOW-RISK PREGNANCY VISIT Patient name: Sarah Nichols MRN [id number]  Date of birth: 10-Dec-1995 Chief Complaint:   Routine Prenatal Visit  History of Present Illness:   Sarah Nichols is a 25 y.o. G80P0020 female at [redacted]w[redacted]d with an Estimated Date of Delivery: 06/19/21 being seen today for ongoing management of a low-risk pregnancy.   Today she reports  increased anxiety/panic, wants to increase zoloft to 100mg  . Has taking waterbirth class, will send certificate. Contractions: Not present. Vag. Bleeding: None.  Movement: Present. denies leaking of fluid.  Depression screen [facility] 2/9 03/29/2021 12/08/2020  Decreased Interest 1 1  Down, Depressed, Hopeless 1 1  PHQ - 2 Score 2 2  Altered sleeping 2 2  Tired, decreased energy 2 3  Change in appetite 1 1  Feeling bad or failure about yourself  1 1  Trouble concentrating 1 2  Moving slowly or fidgety/restless 0 3  Suicidal thoughts 0 0  PHQ-9 Score 9 14     GAD 7 : Generalized Anxiety Score 03/29/2021 12/08/2020  Nervous, Anxious, on Edge 2 3  Control/stop worrying 2 2  Worry too much - different things 1 2  Trouble relaxing 1 1  Restless 2 2  Easily annoyed or irritable 1 2  Afraid - awful might happen 2 2  Total GAD 7 Score 11 14      Review of Systems:   Pertinent items are noted in HPI Denies abnormal vaginal discharge w/ itching/odor/irritation, headaches, visual changes, shortness of breath, chest pain, abdominal pain, severe nausea/vomiting, or problems with urination or bowel movements unless otherwise stated above. Pertinent History Reviewed:  Reviewed past medical,surgical, social, obstetrical and family history.  Reviewed problem list, medications and allergies. Physical Assessment:   Vitals:   03/29/21 0948  BP: 124/83  Pulse: 90  Weight: 215 lb (97.5 kg)  Body mass index is 36.9 kg/m.        Physical Examination:   General appearance: Well appearing, and in no distress  Mental status: Alert,  oriented to person, place, and time  Skin: Warm & dry  Cardiovascular: Normal heart rate noted  Respiratory: Normal respiratory effort, no distress  Abdomen: Soft, gravid, nontender  Pelvic: Cervical exam deferred         Extremities: Edema: None  Fetal Status: Fetal Heart Rate (bpm): 160 Fundal Height: 27 cm Movement: Present    Chaperone: N/A   No results found for this or any previous visit (from the past 24 hour(s)).  Assessment & Plan:  1) Low-risk pregnancy G3P0020 at [redacted]w[redacted]d with an Estimated Date of Delivery: 06/19/21   2) Interested in 06/21/21, has taken class, will send certificate via mychart  3) Dep/anx> increased anxiety, increase zoloft to 100mg    Meds:  Meds ordered this encounter  Medications   sertraline (ZOLOFT) 100 MG tablet    Sig: Take 1 tablet (100 mg total) by mouth daily.    Dispense:  90 tablet    Refill:  3    Order Specific Question:   Supervising Provider    Answer:   Systems developer [[id number]]    Labs/procedures today: tdap and PN2  Plan:  Continue routine obstetrical care  Next visit: prefers in person    Reviewed: Preterm labor symptoms and general obstetric precautions including but not limited to vaginal bleeding, contractions, leaking of fluid and fetal movement were reviewed in detail with the patient.  All questions were answered. Does have home bp cuff. Office bp cuff given: not applicable. Check  bp weekly, let us know if consistently >140 and/or >90.  Follow-up: Return in about 4 weeks (around 04/26/2021) for LROB, CNM, in person.  Future Appointments  Date Time Provider Department Center  04/26/2021 10:30 AM Cheral Marker, CNM [facility]    Orders Placed This Encounter  Procedures   Tdap vaccine greater than or equal to 7yo IM   Cheral Marker CNM, [facility] 03/29/2021 10:20 AM

## 2021-03-30 DIAGNOSIS — F4325 Adjustment disorder with mixed disturbance of emotions and conduct: Secondary | ICD-10-CM | POA: Diagnosis not present

## 2021-03-30 LAB — CBC
Hematocrit: 34.8 % (ref 34.0–46.6)
Hemoglobin: 11.6 g/dL (ref 11.1–15.9)
MCH: 28.1 pg (ref 26.6–33.0)
MCHC: 33.3 g/dL (ref 31.5–35.7)
MCV: 84 fL (ref 79–97)
Platelets: 246 10*3/uL (ref 150–450)
RBC: 4.13 x10E6/uL (ref 3.77–5.28)
RDW: 12.1 % (ref 11.7–15.4)
WBC: 12.2 10*3/uL — ABNORMAL HIGH (ref 3.4–10.8)

## 2021-03-30 LAB — RPR: RPR Ser Ql: NONREACTIVE

## 2021-03-30 LAB — GLUCOSE TOLERANCE, 2 HOURS W/ 1HR
Glucose, 1 hour: 159 mg/dL (ref 65–179)
Glucose, 2 hour: 109 mg/dL (ref 65–152)
Glucose, Fasting: 86 mg/dL (ref 65–91)

## 2021-03-30 LAB — HIV ANTIBODY (ROUTINE TESTING W REFLEX): HIV Screen 4th Generation wRfx: NONREACTIVE

## 2021-03-30 LAB — ANTIBODY SCREEN: Antibody Screen: NEGATIVE

## 2021-04-13 ENCOUNTER — Other Ambulatory Visit: Payer: Self-pay

## 2021-04-13 ENCOUNTER — Inpatient Hospital Stay (HOSPITAL_COMMUNITY)
Admission: AD | Admit: 2021-04-13 | Discharge: 2021-04-13 | Disposition: A | Discharge status: Home / Self Care | Payer: BC Managed Care – PPO | Attending: Obstetrics & Gynecology | Admitting: Obstetrics & Gynecology

## 2021-04-13 ENCOUNTER — Encounter (HOSPITAL_COMMUNITY): Payer: Self-pay | Admitting: Obstetrics & Gynecology

## 2021-04-13 DIAGNOSIS — Z6791 Unspecified blood type, Rh negative: Secondary | ICD-10-CM

## 2021-04-13 DIAGNOSIS — R059 Cough, unspecified: Secondary | ICD-10-CM | POA: Diagnosis not present

## 2021-04-13 DIAGNOSIS — Z3A30 30 weeks gestation of pregnancy: Secondary | ICD-10-CM

## 2021-04-13 DIAGNOSIS — O98513 Other viral diseases complicating pregnancy, third trimester: Secondary | ICD-10-CM | POA: Diagnosis not present

## 2021-04-13 DIAGNOSIS — O26893 Other specified pregnancy related conditions, third trimester: Secondary | ICD-10-CM | POA: Diagnosis not present

## 2021-04-13 DIAGNOSIS — O26899 Other specified pregnancy related conditions, unspecified trimester: Secondary | ICD-10-CM

## 2021-04-13 DIAGNOSIS — O212 Late vomiting of pregnancy: Secondary | ICD-10-CM | POA: Insufficient documentation

## 2021-04-13 DIAGNOSIS — R509 Fever, unspecified: Secondary | ICD-10-CM

## 2021-04-13 DIAGNOSIS — U071 COVID-19: Secondary | ICD-10-CM | POA: Diagnosis not present

## 2021-04-13 DIAGNOSIS — R112 Nausea with vomiting, unspecified: Secondary | ICD-10-CM

## 2021-04-13 LAB — BASIC METABOLIC PANEL
Anion gap: 11 (ref 5–15)
BUN: 6 mg/dL (ref 6–20)
CO2: 21 mmol/L — ABNORMAL LOW (ref 22–32)
Calcium: 8.9 mg/dL (ref 8.9–10.3)
Chloride: 99 mmol/L (ref 98–111)
Creatinine, Ser: 0.46 mg/dL (ref 0.44–1.00)
GFR, Estimated: >60 mL/min (ref >60)
Glucose, Bld: 111 mg/dL — ABNORMAL HIGH (ref 70–99)
Potassium: 3.7 mmol/L (ref 3.5–5.1)
Sodium: 131 mmol/L — ABNORMAL LOW (ref 135–145)

## 2021-04-13 LAB — CBC
HCT: 33.1 % — ABNORMAL LOW (ref 36.0–46.0)
Hemoglobin: 11.1 g/dL — ABNORMAL LOW (ref 12.0–15.0)
MCH: 28 pg (ref 26.0–34.0)
MCHC: 33.5 g/dL (ref 30.0–36.0)
MCV: 83.6 fL (ref 80.0–100.0)
Platelets: 195 10*3/uL (ref 150–400)
RBC: 3.96 MIL/uL (ref 3.87–5.11)
RDW: 12.4 % (ref 11.5–15.5)
WBC: 13.4 10*3/uL — ABNORMAL HIGH (ref 4.0–10.5)
nRBC: 0 % (ref 0.0–0.2)

## 2021-04-13 LAB — URINALYSIS, ROUTINE W REFLEX MICROSCOPIC
Bilirubin Urine: NEGATIVE
Glucose, UA: NEGATIVE mg/dL
Hgb urine dipstick: NEGATIVE
Ketones, ur: 15 mg/dL — AB
Leukocytes,Ua: NEGATIVE
Nitrite: NEGATIVE
Protein, ur: NEGATIVE mg/dL
Specific Gravity, Urine: 1.025 (ref 1.005–1.030)
pH: 6.5 (ref 5.0–8.0)

## 2021-04-13 LAB — RESP PANEL BY RT-PCR (FLU A&B, COVID) ARPGX2
Influenza A by PCR: NEGATIVE
Influenza B by PCR: NEGATIVE
SARS Coronavirus 2 by RT PCR: POSITIVE — AB

## 2021-04-13 MED ORDER — ONDANSETRON HCL 4 MG/2ML IJ SOLN
4.0000 mg | Freq: Once | INTRAMUSCULAR | Status: AC
  Administered 2021-04-13: 4 mg via INTRAVENOUS
  Filled 2021-04-13: qty 2

## 2021-04-13 MED ORDER — ACETAMINOPHEN 325 MG PO TABS
650.0000 mg | ORAL_TABLET | Freq: Once | ORAL | Status: AC
  Administered 2021-04-13: 650 mg via ORAL
  Filled 2021-04-13: qty 2

## 2021-04-13 MED ORDER — ONDANSETRON 4 MG PO TBDP: 4.0000 mg | ORAL_TABLET | Freq: Four times a day (QID) | ORAL | 0 refills | Status: DC | PRN

## 2021-04-13 MED ORDER — GUAIFENESIN ER 600 MG PO TB12: 600.0000 mg | ORAL_TABLET | Freq: Two times a day (BID) | ORAL | 1 refills | Status: DC

## 2021-04-13 MED ORDER — LACTATED RINGERS IV SOLN: Freq: Once | INTRAVENOUS | Status: AC

## 2021-04-13 NOTE — MAU Note (Signed)
Pt's husband tested positive for covid one week ago. Pt began having symptoms yesterday, headache, body aches, dizziness, fatigue, nausea and vomiting.

## 2021-04-13 NOTE — MAU Provider Note (Signed)
Chief Complaint:  Covid Exposure   Event Date/Time   First Provider Initiated Contact with Patient 04/13/21 [phone]     HPI: Sarah Nichols is a 25 y.o. G3P0020 at 32w3dwho presents to maternity admissions reporting body aches, fever (100.7), headache, vomiting and aching in hips.. She reports decreased fetal movement, denies LOF, vaginal bleeding, vaginal itching/burning, urinary symptoms, diarrhea, constipation  Reports husband had symptoms last week but tested positive yesterday for covid. Wants to be written out of work if she is positive.   Emesis  This is a new problem. The current episode started today. The maximum temperature recorded prior to her arrival was 100.4 - 100.9 F. Associated symptoms include chills, coughing, dizziness, a fever, headaches and myalgias. Pertinent negatives include no abdominal pain or diarrhea. Risk factors include ill contacts. She has tried acetaminophen for the symptoms. The treatment provided no relief.  Cough This is a new problem. The current episode started today. The problem has been unchanged. The cough is Non-productive. Associated symptoms include chills, a fever, headaches, myalgias and nasal congestion. Pertinent negatives include no wheezing. She has tried nothing for the symptoms.  Fever  This is a new problem. The current episode started today. The problem has been unchanged. The maximum temperature noted was 100 to 100.9 F. The temperature was taken using an oral thermometer. Associated symptoms include coughing, headaches and vomiting. Pertinent negatives include no abdominal pain, diarrhea or wheezing. She has tried acetaminophen for the symptoms. The treatment provided mild relief.   RN Note: Pt's husband tested positive for covid one week ago. Pt began having symptoms yesterday, headache, body aches, dizziness, fatigue, nausea and vomiting.   Past Medical History: Past Medical History:  Diagnosis Date   Ovarian cyst    PTSD  (post-traumatic stress disorder)     Past obstetric history: OB History  Gravida Para Term Preterm AB Living  3       2    SAB IAB Ectopic Multiple Live Births  2            # Outcome Date GA Lbr Len/2nd Weight Sex Delivery Anes PTL Lv  3 Current           2 SAB 03/21/19 [redacted]w[redacted]d         1 SAB 11/2018            Past Surgical History: Past Surgical History:  Procedure Laterality Date   DILATION AND EVACUATION N/A 03/21/2019   Procedure: DILATATION AND EVACUATION;  Surgeon: Linzie Collin, MD;  Location: [facility] ORS;  Service: Gynecology;  Laterality: N/A;   KNEE ARTHROSCOPY WITH ANTERIOR CRUCIATE LIGAMENT (ACL) REPAIR WITH HAMSTRING GRAFT     no history      Family History: Family History  Problem Relation Age of Onset   Cervical cancer Mother    Depression Mother    Anxiety disorder Mother    Hypertension Father    Gallstones Paternal Grandmother    Cancer Paternal Grandmother        skin    Social History: Social History   Tobacco Use   Smoking status: Never   Smokeless tobacco: Never  Vaping Use   Vaping Use: Never used  Substance Use Topics   Alcohol use: Not Currently   Drug use: Not Currently    Types: Marijuana    Allergies: No Known Allergies  Meds:  Medications Prior to Admission  Medication Sig Dispense Refill Last Dose   acetaminophen (TYLENOL) 500 MG tablet Take 1,000 mg  by mouth every 6 (six) hours as needed (for pain.).      doxylamine, Sleep, (UNISOM) 25 MG tablet Take 12.5 mg by mouth at bedtime as needed. (Patient not taking: No sig reported)      prenatal vitamin w/FE, FA (PRENATAL 1 + 1) 27-1 MG TABS tablet Take 1 tablet by mouth daily at 12 noon.      sertraline (ZOLOFT) 100 MG tablet Take 1 tablet (100 mg total) by mouth daily. 90 tablet 3    vitamin B-6 (PYRIDOXINE) 25 MG tablet Take 25 mg by mouth daily.       I have reviewed patient's Past Medical Hx, Surgical Hx, Family Hx, Social Hx, medications and allergies.   ROS:  Review of  Systems  Constitutional:  Positive for chills and fever.  Respiratory:  Positive for cough. Negative for wheezing.   Gastrointestinal:  Positive for vomiting. Negative for abdominal pain and diarrhea.  Musculoskeletal:  Positive for myalgias.  Neurological:  Positive for dizziness and headaches.  Other systems negative  Physical Exam  Patient Vitals for the past 24 hrs:  BP Temp Temp src Pulse Resp SpO2 Height Weight  04/13/21 0452 127/67 (!) 100.7 F (38.2 C) Oral (!) 105 19 97 % 5\' 4"  (1.626 m) 97.1 kg   Constitutional: Well-developed, well-nourished female in no acute distress, but ill-appearing  Cardiovascular: normal rate and rhythm Respiratory: normal effort, clear to auscultation bilaterally, diminished due to inability to breathe deeply due to cough   Oxygen saturation 98% GI: Abd soft, non-tender, gravid appropriate for gestational age.   No rebound or guarding. MS: Extremities nontender, no edema, normal ROM Neurologic: Alert and oriented x 4.  GU: Neg CVAT.  FHT:  Baseline 150 , moderate variability, accelerations present, no decelerations Contractions: occasional   Labs: Results for orders placed or performed during the hospital encounter of 04/13/21 (from the past 24 hour(s))  Urinalysis, Routine w reflex microscopic Urine, Clean Catch     Status: Abnormal   Collection Time: 04/13/21  5:11 AM  Result Value Ref Range   Color, Urine YELLOW YELLOW   APPearance CLEAR CLEAR   Specific Gravity, Urine 1.025 1.005 - 1.030   pH 6.5 5.0 - 8.0   Glucose, UA NEGATIVE NEGATIVE mg/dL   Hgb urine dipstick NEGATIVE NEGATIVE   Bilirubin Urine NEGATIVE NEGATIVE   Ketones, ur 15 (A) NEGATIVE mg/dL   Protein, ur NEGATIVE NEGATIVE mg/dL   Nitrite NEGATIVE NEGATIVE   Leukocytes,Ua NEGATIVE NEGATIVE  Resp Panel by RT-PCR (Flu A&B, Covid) Nasopharyngeal Swab     Status: Abnormal   Collection Time: 04/13/21  5:53 AM   Specimen: Nasopharyngeal Swab; Nasopharyngeal(NP) swabs in vial  transport medium  Result Value Ref Range   SARS Coronavirus 2 by RT PCR POSITIVE (A) NEGATIVE   Influenza A by PCR NEGATIVE NEGATIVE   Influenza B by PCR NEGATIVE NEGATIVE  CBC     Status: Abnormal   Collection Time: 04/13/21  5:53 AM  Result Value Ref Range   WBC 13.4 (H) 4.0 - 10.5 K/uL   RBC 3.96 3.87 - 5.11 MIL/uL   Hemoglobin 11.1 (L) 12.0 - 15.0 g/dL   HCT 04/15/21 (L) 29.5 - 28.4 %   MCV 83.6 80.0 - 100.0 fL   MCH 28.0 26.0 - 34.0 pg   MCHC 33.5 30.0 - 36.0 g/dL   RDW 13.2 44.0 - 10.2 %   Platelets 195 150 - 400 K/uL   nRBC 0.0 0.0 - 0.2 %  Basic metabolic  panel     Status: Abnormal   Collection Time: 04/13/21  5:53 AM  Result Value Ref Range   Sodium 131 (L) 135 - 145 mmol/L   Potassium 3.7 3.5 - 5.1 mmol/L   Chloride 99 98 - 111 mmol/L   CO2 21 (L) 22 - 32 mmol/L   Glucose, Bld 111 (H) 70 - 99 mg/dL   BUN 6 6 - 20 mg/dL   Creatinine, Ser 1.11 0.44 - 1.00 mg/dL   Calcium 8.9 8.9 - 73.5 mg/dL   GFR, Estimated >67 >01 mL/min   Anion gap 11 5 - 15    O/Negative/-- (05/11 1448)  Imaging:  No results found.  MAU Course/MDM: I have ordered labs and reviewed results. No significant leukocytosis or hypokalemia Covid test is positive.   UA showed no dehydration NST reviewed, reassuring   Treatments in MAU included IV hydration, Zofran (which did help nausea), Tylenol.    Assessment: Single IUP at [redacted]w[redacted]d Covid Fever, nausea, vomiting, cough  Plan: Discharge home Reviewed natural course of Covid Rx Mucinex for cough Rx Zofran for nausea Reviewed supportive care. Discussed option of Paxlovid, declines Preterm Labor precautions and fetal kick counts Follow up in Office for prenatal visits  Encouraged to return if she develops worsening of symptoms, increase in pain, fever, or other concerning symptoms.  Pt stable at time of discharge.  Wynelle Bourgeois CNM, MSN Certified Nurse-Midwife 04/13/2021 4:58 AM

## 2021-04-13 NOTE — Progress Notes (Signed)
OK to d/c EFM per Marie Williams CNM 

## 2021-04-20 DIAGNOSIS — F4325 Adjustment disorder with mixed disturbance of emotions and conduct: Secondary | ICD-10-CM | POA: Diagnosis not present

## 2021-04-26 ENCOUNTER — Encounter: Payer: Self-pay | Admitting: Women's Health

## 2021-04-26 ENCOUNTER — Other Ambulatory Visit: Payer: Self-pay

## 2021-04-26 ENCOUNTER — Ambulatory Visit (INDEPENDENT_AMBULATORY_CARE_PROVIDER_SITE_OTHER): Payer: BC Managed Care – PPO | Admitting: Women's Health

## 2021-04-26 VITALS — BP 121/75 | HR 101 | Wt 211.0 lb

## 2021-04-26 DIAGNOSIS — Z3A32 32 weeks gestation of pregnancy: Secondary | ICD-10-CM

## 2021-04-26 DIAGNOSIS — Z6791 Unspecified blood type, Rh negative: Secondary | ICD-10-CM

## 2021-04-26 DIAGNOSIS — O26899 Other specified pregnancy related conditions, unspecified trimester: Secondary | ICD-10-CM

## 2021-04-26 DIAGNOSIS — Z3483 Encounter for supervision of other normal pregnancy, third trimester: Secondary | ICD-10-CM

## 2021-04-26 DIAGNOSIS — Z23 Encounter for immunization: Secondary | ICD-10-CM | POA: Diagnosis not present

## 2021-04-26 DIAGNOSIS — Z3480 Encounter for supervision of other normal pregnancy, unspecified trimester: Secondary | ICD-10-CM

## 2021-04-26 NOTE — Patient Instructions (Signed)
Sarah Nichols, thank you for choosing our office today! We appreciate the opportunity to meet your healthcare needs. You may receive a short survey by mail, e-mail, or through [employer]. If you are happy with your care we would appreciate if you could take just a few minutes to complete the survey questions. We read all of your comments and take your feedback very seriously. Thank you again for choosing our office.  Center for [employer] Team at [facility]  [facility] at [facility] ([address] [place], [place] [address]) Entrance C, located off of [address] Free 24/7 valet parking   CLASSES: Go to [email or web address] to register for classes (childbirth, breastfeeding, waterbirth, infant CPR, daddy bootcamp, etc.)  Call the office [phone]) or go to [facility] if: You begin to have strong, frequent contractions Your water breaks.  Sometimes it is a big gush of fluid, sometimes it is just a trickle that keeps getting your panties wet or running down your legs You have vaginal bleeding.  It is normal to have a small amount of spotting if your cervix was checked.  You don't feel your baby moving like normal.  If you don't, get you something to eat and drink and lay down and focus on feeling your baby move.   If your baby is still not moving like normal, you should call the office or go to [facility].  Call the office [phone]) or go to [facility] hospital for these signs of pre-eclampsia: Severe headache that does not go away with Tylenol Visual changes- seeing spots, double, blurred vision Pain under your right breast or upper abdomen that does not go away with Tums or heartburn medicine Nausea and/or vomiting Severe swelling in your hands, feet, and face   Tdap Vaccine It is recommended that you get the Tdap vaccine during the third trimester of EACH pregnancy to help protect your baby from getting pertussis (whooping cough) 27-36 weeks is the BEST time to do  this so that you can pass the protection on to your baby. During pregnancy is better than after pregnancy, but if you are unable to get it during pregnancy it will be offered at the hospital.  You can get this vaccine with [place], at the health department, your family doctor, or some local pharmacies Everyone who will be around your baby should also be up-to-date on their vaccines before the baby comes. Adults (who are not pregnant) only need 1 dose of Tdap during adulthood.   [facility] Pediatricians/Family Doctors [facility]): [address] Colette Ribas, [phone]           [facility]: [address], [phone]                [facility] Medicine [facility]): [address], [phone] (call to ask if accepting patients) [facility] Department: [address], [place], [phone]    [facility] Pediatricians/Family Doctors [facility]): [phone] [address], [phone] [facility]: [address] [place], [phone] [facility]: [address], [phone]  [facility] Doctors  [facility]): [phone] [facility]: [address] [phone]   [facility] Doctors [facility]: [address], [phone]  [facility] Family Doctors  [facility]: [phone], [phone]  Home Blood Pressure Monitoring for Patients   Your provider has recommended that you check your  blood pressure (BP) at least once a week at home. If you do not have a blood pressure cuff at home, one will be provided for you. Contact your provider if you have not received your monitor within 1 week.   Helpful Tips for Accurate Home Blood Pressure Checks  Don't smoke, exercise, or drink caffeine 30 minutes before checking your BP Use the restroom before checking your BP (a full bladder can raise your  pressure) Relax in a comfortable upright chair Feet on the ground Left arm resting comfortably on a flat surface at the level of your heart Legs uncrossed Back supported Sit quietly and don't talk Place the cuff on your bare arm Adjust snuggly, so that only two fingertips can fit between your skin and the top of the cuff Check 2 readings separated by at least one minute Keep a log of your BP readings For a visual, please reference this diagram: http://ccnc.care/bpdiagram  Provider Name: [facility]     Phone: [phone]  Zone 1: ALL CLEAR  Continue to monitor your symptoms:  BP reading is less than 140 (top number) or less than 90 (bottom number)  No right upper stomach pain No headaches or seeing spots No feeling nauseated or throwing up No swelling in face and hands  Zone 2: CAUTION Call your doctor's office for any of the following:  BP reading is greater than 140 (top number) or greater than 90 (bottom number)  Stomach pain under your ribs in the middle or right side Headaches or seeing spots Feeling nauseated or throwing up Swelling in face and hands  Zone 3: EMERGENCY  Seek immediate medical care if you have any of the following:  BP reading is greater than160 (top number) or greater than 110 (bottom number) Severe headaches not improving with Tylenol Serious difficulty catching your breath Any worsening symptoms from Zone 2  Preterm Labor and Birth Information  The normal length of a pregnancy is 39-41 weeks. Preterm labor is when labor starts before 37 completed weeks of pregnancy. What are the risk factors for preterm labor? Preterm labor is more likely to occur in women who: Have certain infections during pregnancy such as a bladder infection, sexually transmitted infection, or infection inside the uterus (chorioamnionitis). Have a shorter-than-normal cervix. Have gone into preterm labor before. Have had surgery on their cervix. Are younger than age 17  or older than age 35. Are African American. Are pregnant with twins or multiple babies (multiple gestation). Take street drugs or smoke while pregnant. Do not gain enough weight while pregnant. Became pregnant shortly after having been pregnant. What are the symptoms of preterm labor? Symptoms of preterm labor include: Cramps similar to those that can happen during a menstrual period. The cramps may happen with diarrhea. Pain in the abdomen or lower back. Regular uterine contractions that may feel like tightening of the abdomen. A feeling of increased pressure in the pelvis. Increased watery or bloody mucus discharge from the vagina. Water breaking (ruptured amniotic sac). Why is it important to recognize signs of preterm labor? It is important to recognize signs of preterm labor because babies who are born prematurely may not be fully developed. This can put them at an increased risk for: Long-term (chronic) heart and lung problems. Difficulty immediately after birth with regulating body systems, including blood sugar, body temperature, heart rate, and breathing rate. Bleeding in the brain. Cerebral palsy. Learning difficulties. Death. These risks are highest for babies who are born before 34 weeks   of pregnancy. How is preterm labor treated? Treatment depends on the length of your pregnancy, your condition, and the health of your baby. It may involve: Having a stitch (suture) placed in your cervix to prevent your cervix from opening too early (cerclage). Taking or being given medicines, such as: Hormone medicines. These may be given early in pregnancy to help support the pregnancy. Medicine to stop contractions. Medicines to help mature the baby's lungs. These may be prescribed if the risk of delivery is high. Medicines to prevent your baby from developing cerebral palsy. If the labor happens before 34 weeks of pregnancy, you may need to stay in the hospital. What should I do if I  think I am in preterm labor? If you think that you are going into preterm labor, call your health care provider right away. How can I prevent preterm labor in future pregnancies? To increase your chance of having a full-term pregnancy: Do not use any tobacco products, such as cigarettes, chewing tobacco, and e-cigarettes. If you need help quitting, ask your health care provider. Do not use street drugs or medicines that have not been prescribed to you during your pregnancy. Talk with your health care provider before taking any herbal supplements, even if you have been taking them regularly. Make sure you gain a healthy amount of weight during your pregnancy. Watch for infection. If you think that you might have an infection, get it checked right away. Make sure to tell your health care provider if you have gone into preterm labor before. This information is not intended to replace advice given to you by your health care provider. Make sure you discuss any questions you have with your health care provider. Document Revised: 11/08/2018 Document Reviewed: 12/08/2015 Elsevier Patient Education  2020 Elsevier Inc.   

## 2021-04-26 NOTE — Progress Notes (Signed)
    LOW-RISK PREGNANCY VISIT Patient name: Sarah Nichols MRN [id number]  Date of birth: Oct 17, 1995 Chief Complaint:   Routine Prenatal Visit  History of Present Illness:   Sarah Nichols is a 25 y.o. G66P0020 female at [redacted]w[redacted]d with an Estimated Date of Delivery: 06/19/21 being seen today for ongoing management of a low-risk pregnancy.   Today she reports no complaints. Contractions: Not present. Vag. Bleeding: None.  Movement: Present. denies leaking of fluid.  Depression screen [facility] 2/9 03/29/2021 12/08/2020  Decreased Interest 1 1  Down, Depressed, Hopeless 1 1  PHQ - 2 Score 2 2  Altered sleeping 2 2  Tired, decreased energy 2 3  Change in appetite 1 1  Feeling bad or failure about yourself  1 1  Trouble concentrating 1 2  Moving slowly or fidgety/restless 0 3  Suicidal thoughts 0 0  PHQ-9 Score 9 14     GAD 7 : Generalized Anxiety Score 03/29/2021 12/08/2020  Nervous, Anxious, on Edge 2 3  Control/stop worrying 2 2  Worry too much - different things 1 2  Trouble relaxing 1 1  Restless 2 2  Easily annoyed or irritable 1 2  Afraid - awful might happen 2 2  Total GAD 7 Score 11 14      Review of Systems:   Pertinent items are noted in HPI Denies abnormal vaginal discharge w/ itching/odor/irritation, headaches, visual changes, shortness of breath, chest pain, abdominal pain, severe nausea/vomiting, or problems with urination or bowel movements unless otherwise stated above. Pertinent History Reviewed:  Reviewed past medical,surgical, social, obstetrical and family history.  Reviewed problem list, medications and allergies. Physical Assessment:   Vitals:   04/26/21 1033  BP: 121/75  Pulse: (!) 101  Weight: 211 lb (95.7 kg)  Body mass index is 36.22 kg/m.        Physical Examination:   General appearance: Well appearing, and in no distress  Mental status: Alert, oriented to person, place, and time  Skin: Warm & dry  Cardiovascular: Normal heart rate  noted  Respiratory: Normal respiratory effort, no distress  Abdomen: Soft, gravid, nontender  Pelvic: Cervical exam deferred         Extremities: Edema: None  Fetal Status: Fetal Heart Rate (bpm): 142 Fundal Height: 31 cm Movement: Present    Chaperone: N/A   No results found for this or any previous visit (from the past 24 hour(s)).  Assessment & Plan:  1) Low-risk pregnancy G3P0020 at [redacted]w[redacted]d with an Estimated Date of Delivery: 06/19/21   2) Planning waterbirth, went to class, send [place] certificate today   Meds: No orders of the defined types were placed in this encounter.  Labs/procedures today: flu shot and Rhogam  Plan:  Continue routine obstetrical care  Next visit: prefers in person    Reviewed: Preterm labor symptoms and general obstetric precautions including but not limited to vaginal bleeding, contractions, leaking of fluid and fetal movement were reviewed in detail with the patient.  All questions were answered. Does have home bp cuff. Office bp cuff given: not applicable. Check bp weekly, let us know if consistently >140 and/or >90.  Follow-up: Return in about 2 weeks (around 05/10/2021) for LROB, CNM, in person.  No future appointments.  Orders Placed This Encounter  Procedures   RHO (D) Immune Globulin   Flu Vaccine QUAD 21mo+IM (Fluarix, Fluzone & Alfiuria Quad PF)   Cheral Marker CNM, [facility] 04/26/2021 10:59 AM

## 2021-04-27 DIAGNOSIS — F4325 Adjustment disorder with mixed disturbance of emotions and conduct: Secondary | ICD-10-CM | POA: Diagnosis not present

## 2021-05-04 DIAGNOSIS — F4325 Adjustment disorder with mixed disturbance of emotions and conduct: Secondary | ICD-10-CM | POA: Diagnosis not present

## 2021-05-10 ENCOUNTER — Encounter: Payer: Self-pay | Admitting: Women's Health

## 2021-05-10 ENCOUNTER — Ambulatory Visit (INDEPENDENT_AMBULATORY_CARE_PROVIDER_SITE_OTHER): Payer: BC Managed Care – PPO | Admitting: Women's Health

## 2021-05-10 ENCOUNTER — Other Ambulatory Visit: Payer: Self-pay

## 2021-05-10 VITALS — BP 122/73 | HR 83 | Wt 212.0 lb

## 2021-05-10 DIAGNOSIS — Z3483 Encounter for supervision of other normal pregnancy, third trimester: Secondary | ICD-10-CM

## 2021-05-10 MED ORDER — PANTOPRAZOLE SODIUM 20 MG PO TBEC: 20.0000 mg | DELAYED_RELEASE_TABLET | Freq: Every day | ORAL | 1 refills | Status: DC

## 2021-05-10 NOTE — Progress Notes (Signed)
LOW-RISK PREGNANCY VISIT Patient name: Sarah Nichols MRN [id number]  Date of birth: 10-24-1995 Chief Complaint:   Routine Prenatal Visit  History of Present Illness:   Sarah Nichols is a 25 y.o. G44P0020 female at [redacted]w[redacted]d with an Estimated Date of Delivery: 06/19/21 being seen today for ongoing management of a low-risk pregnancy.   Today she reports heartburn terrible, TUMS not helping anymore. Contractions: Not present. Vag. Bleeding: None.  Movement: Present. denies leaking of fluid.  Depression screen [facility] 2/9 03/29/2021 12/08/2020  Decreased Interest 1 1  Down, Depressed, Hopeless 1 1  PHQ - 2 Score 2 2  Altered sleeping 2 2  Tired, decreased energy 2 3  Change in appetite 1 1  Feeling bad or failure about yourself  1 1  Trouble concentrating 1 2  Moving slowly or fidgety/restless 0 3  Suicidal thoughts 0 0  PHQ-9 Score 9 14     GAD 7 : Generalized Anxiety Score 03/29/2021 12/08/2020  Nervous, Anxious, on Edge 2 3  Control/stop worrying 2 2  Worry too much - different things 1 2  Trouble relaxing 1 1  Restless 2 2  Easily annoyed or irritable 1 2  Afraid - awful might happen 2 2  Total GAD 7 Score 11 14      Review of Systems:   Pertinent items are noted in HPI Denies abnormal vaginal discharge w/ itching/odor/irritation, headaches, visual changes, shortness of breath, chest pain, abdominal pain, severe nausea/vomiting, or problems with urination or bowel movements unless otherwise stated above. Pertinent History Reviewed:  Reviewed past medical,surgical, social, obstetrical and family history.  Reviewed problem list, medications and allergies. Physical Assessment:   Vitals:   05/10/21 1139  BP: 122/73  Pulse: 83  Weight: 212 lb (96.2 kg)  Body mass index is 36.39 kg/m.        Physical Examination:   General appearance: Well appearing, and in no distress  Mental status: Alert, oriented to person, place, and time  Skin: Warm &  dry  Cardiovascular: Normal heart rate noted  Respiratory: Normal respiratory effort, no distress  Abdomen: Soft, gravid, nontender  Pelvic: Cervical exam deferred         Extremities: Edema: None  Fetal Status: Fetal Heart Rate (bpm): 140 Fundal Height: 34 cm Movement: Present    Chaperone: N/A   No results found for this or any previous visit (from the past 24 hour(s)).  Assessment & Plan:  1) Low-risk pregnancy G3P0020 at [redacted]w[redacted]d with an Estimated Date of Delivery: 06/19/21   2) Heartburn, rx protonix  3) Wants waterbirth> sent class certificate via mychart just now, sign consent next week   Meds:  Meds ordered this encounter  Medications   pantoprazole (PROTONIX) 20 MG tablet    Sig: Take 1 tablet (20 mg total) by mouth daily.    Dispense:  30 tablet    Refill:  1    Order Specific Question:   Supervising Provider    Answer:   Lazaro Arms [[id number]]    Labs/procedures today: none  Plan:  Continue routine obstetrical care  Next visit: prefers will be in person for cultures     Reviewed: Preterm labor symptoms and general obstetric precautions including but not limited to vaginal bleeding, contractions, leaking of fluid and fetal movement were reviewed in detail with the patient.  All questions were answered. Does have home bp cuff. Office bp cuff given: not applicable. Check bp weekly, let us know if consistently >140 and/or >90.  Follow-up: Return in about 2 weeks (around 05/24/2021) for LROB, CNM only, in person.  No future appointments.  No orders of the defined types were placed in this encounter.  Cheral Marker CNM, [facility] 05/10/2021 12:09 PM

## 2021-05-10 NOTE — Patient Instructions (Signed)
[place], thank you for choosing our office today! We appreciate the opportunity to meet your healthcare needs. You may receive a short survey by mail, e-mail, or through MyChart. If you are happy with your care we would appreciate if you could take just a few minutes to complete the survey questions. We read all of your comments and take your feedback very seriously. Thank you again for choosing our office.  [facility] [place] ([address] [place], [place] [address]) Entrance C, located off of [address] Free 24/7 valet parking   CLASSES: Go to [email or web address] to register for classes (childbirth, breastfeeding, waterbirth, infant CPR, daddy bootcamp, etc.)  Call the office ([phone]) or go to [facility] if: You begin to have strong, frequent contractions Your water breaks.  Sometimes it is a big gush of fluid, sometimes it is just a trickle that keeps getting your panties wet or running down your legs You have vaginal bleeding.  It is normal to have a small amount of spotting if your cervix was checked.  You don't feel your baby moving like normal.  If you don't, get you something to eat and drink and lay down and focus on feeling your baby move.   If your baby is still not moving like normal, you should call the office or go to [facility].  Call the office ([phone]) or go to [facility] for these signs of pre-eclampsia: Severe headache that does not go away with Tylenol Visual changes- seeing spots, double, blurred vision Pain under your right breast or upper abdomen that does not go away with Tums or heartburn medicine Nausea and/or vomiting Severe swelling in your hands, feet, and face   Tdap Vaccine It is recommended that you get the Tdap vaccine during the third trimester of EACH pregnancy to help protect your baby from getting pertussis (whooping cough) 27-36 weeks is the BEST time to do  this so that you can pass the protection on to your baby. During pregnancy is better than after pregnancy, but if you are unable to get it during pregnancy it will be offered at the hospital.  You can get this vaccine with us, at the health department, your family doctor, or some local pharmacies Everyone who will be around your baby should also be up-to-date on their vaccines before the baby comes. Adults (who are not pregnant) only need 1 dose of Tdap during adulthood.   [place] Pediatricians/Family Doctors [facility] ([facility]): [address], [phone]           [facility]: [address], [phone]                [facility] ([facility]): [address], [phone] (call to ask if accepting patients) [facility]: [address], [place], [phone]    [place] Pediatricians/Family Doctors [facility] ([facility]): [address], [phone] [facility]: [address], [phone] [facility]: [address], [phone]  [place] Family Doctors  [facility] ([facility]): [phone] [facility]: [address], [phone]   [place] Family Doctors [facility]: [address], [phone]  [place] Family Doctors  [facility]: [address], [phone]  Home Blood Pressure Monitoring for Patients   Your provider has recommended that you check your   blood pressure (BP) at least once a week at home. If you do not have a blood pressure cuff at home, one will be provided for you. Contact your provider if you have not received your monitor within 1 week.   Helpful Tips for Accurate Home Blood Pressure Checks  Don't smoke, exercise, or drink caffeine 30 minutes before checking your BP Use the restroom before checking your BP (a full bladder can raise your  pressure) Relax in a comfortable upright chair Feet on the ground Left arm resting comfortably on a flat surface at the level of your heart Legs uncrossed Back supported Sit quietly and don't talk Place the cuff on your bare arm Adjust snuggly, so that only two fingertips can fit between your skin and the top of the cuff Check 2 readings separated by at least one minute Keep a log of your BP readings For a visual, please reference this diagram: http://ccnc.care/bpdiagram  Provider Name: [facility]     Phone: [phone]  Zone 1: ALL CLEAR  Continue to monitor your symptoms:  BP reading is less than 140 (top number) or less than 90 (bottom number)  No right upper stomach pain No headaches or seeing spots No feeling nauseated or throwing up No swelling in face and hands  Zone 2: CAUTION Call your doctor's office for any of the following:  BP reading is greater than 140 (top number) or greater than 90 (bottom number)  Stomach pain under your ribs in the middle or right side Headaches or seeing spots Feeling nauseated or throwing up Swelling in face and hands  Zone 3: EMERGENCY  Seek immediate medical care if you have any of the following:  BP reading is greater than160 (top number) or greater than 110 (bottom number) Severe headaches not improving with Tylenol Serious difficulty catching your breath Any worsening symptoms from Zone 2  Preterm Labor and Birth Information  The normal length of a pregnancy is 39-41 weeks. Preterm labor is when labor starts before 37 completed weeks of pregnancy. What are the risk factors for preterm labor? Preterm labor is more likely to occur in women who: Have certain infections during pregnancy such as a bladder infection, sexually transmitted infection, or infection inside the uterus (chorioamnionitis). Have a shorter-than-normal cervix. Have gone into preterm labor before. Have had surgery on their cervix. Are younger than age 17  or older than age 35. Are African American. Are pregnant with twins or multiple babies (multiple gestation). Take street drugs or smoke while pregnant. Do not gain enough weight while pregnant. Became pregnant shortly after having been pregnant. What are the symptoms of preterm labor? Symptoms of preterm labor include: Cramps similar to those that can happen during a menstrual period. The cramps may happen with diarrhea. Pain in the abdomen or lower back. Regular uterine contractions that may feel like tightening of the abdomen. A feeling of increased pressure in the pelvis. Increased watery or bloody mucus discharge from the vagina. Water breaking (ruptured amniotic sac). Why is it important to recognize signs of preterm labor? It is important to recognize signs of preterm labor because babies who are born prematurely may not be fully developed. This can put them at an increased risk for: Long-term (chronic) heart and lung problems. Difficulty immediately after birth with regulating body systems, including blood sugar, body temperature, heart rate, and breathing rate. Bleeding in the brain. Cerebral palsy. Learning difficulties. Death. These risks are highest for babies who are born before 34 weeks   of pregnancy. How is preterm labor treated? Treatment depends on the length of your pregnancy, your condition, and the health of your baby. It may involve: Having a stitch (suture) placed in your cervix to prevent your cervix from opening too early (cerclage). Taking or being given medicines, such as: Hormone medicines. These may be given early in pregnancy to help support the pregnancy. Medicine to stop contractions. Medicines to help mature the baby's lungs. These may be prescribed if the risk of delivery is high. Medicines to prevent your baby from developing cerebral palsy. If the labor happens before 34 weeks of pregnancy, you may need to stay in the hospital. What should I do if I  think I am in preterm labor? If you think that you are going into preterm labor, call your health care provider right away. How can I prevent preterm labor in future pregnancies? To increase your chance of having a full-term pregnancy: Do not use any tobacco products, such as cigarettes, chewing tobacco, and e-cigarettes. If you need help quitting, ask your health care provider. Do not use street drugs or medicines that have not been prescribed to you during your pregnancy. Talk with your health care provider before taking any herbal supplements, even if you have been taking them regularly. Make sure you gain a healthy amount of weight during your pregnancy. Watch for infection. If you think that you might have an infection, get it checked right away. Make sure to tell your health care provider if you have gone into preterm labor before. This information is not intended to replace advice given to you by your health care provider. Make sure you discuss any questions you have with your health care provider. Document Revised: 11/08/2018 Document Reviewed: 12/08/2015 Elsevier Patient Education  2020 Elsevier Inc.   

## 2021-05-11 DIAGNOSIS — F4325 Adjustment disorder with mixed disturbance of emotions and conduct: Secondary | ICD-10-CM | POA: Diagnosis not present

## 2021-05-13 DIAGNOSIS — F4325 Adjustment disorder with mixed disturbance of emotions and conduct: Secondary | ICD-10-CM | POA: Diagnosis not present

## 2021-05-18 DIAGNOSIS — F4325 Adjustment disorder with mixed disturbance of emotions and conduct: Secondary | ICD-10-CM | POA: Diagnosis not present

## 2021-05-20 DIAGNOSIS — F4325 Adjustment disorder with mixed disturbance of emotions and conduct: Secondary | ICD-10-CM | POA: Diagnosis not present

## 2021-05-24 ENCOUNTER — Other Ambulatory Visit: Payer: Self-pay

## 2021-05-24 ENCOUNTER — Ambulatory Visit (INDEPENDENT_AMBULATORY_CARE_PROVIDER_SITE_OTHER): Payer: BC Managed Care – PPO | Admitting: Women's Health

## 2021-05-24 ENCOUNTER — Encounter: Payer: Self-pay | Admitting: Women's Health

## 2021-05-24 ENCOUNTER — Other Ambulatory Visit (HOSPITAL_COMMUNITY)
Admission: RE | Admit: 2021-05-24 | Discharge: 2021-05-24 | Disposition: A | Discharge status: Home / Self Care | Payer: BC Managed Care – PPO | Source: Ambulatory Visit | Attending: Women's Health | Admitting: Women's Health

## 2021-05-24 ENCOUNTER — Other Ambulatory Visit: Payer: Self-pay | Admitting: Obstetrics and Gynecology

## 2021-05-24 VITALS — BP 107/79 | HR 83 | Wt 212.5 lb

## 2021-05-24 DIAGNOSIS — F32A Depression, unspecified: Secondary | ICD-10-CM

## 2021-05-24 DIAGNOSIS — Z3483 Encounter for supervision of other normal pregnancy, third trimester: Secondary | ICD-10-CM

## 2021-05-24 DIAGNOSIS — Z3480 Encounter for supervision of other normal pregnancy, unspecified trimester: Secondary | ICD-10-CM

## 2021-05-24 DIAGNOSIS — Z3A36 36 weeks gestation of pregnancy: Secondary | ICD-10-CM

## 2021-05-24 DIAGNOSIS — Z029 Encounter for administrative examinations, unspecified: Secondary | ICD-10-CM

## 2021-05-24 NOTE — Patient Instructions (Signed)
Sarah Nichols, thank you for choosing our office today! We appreciate the opportunity to meet your healthcare needs. You may receive a short survey by mail, e-mail, or through [employer]. If you are happy with your care we would appreciate if you could take just a few minutes to complete the survey questions. We read all of your comments and take your feedback very seriously. Thank you again for choosing our office.  [facility] [employer] Team at [facility]  [facility] ([address] [place], [place] [address]) Entrance C, located off of [address] Free 24/7 valet parking   CLASSES: Go to [email or web address] to register for classes (childbirth, breastfeeding, waterbirth, infant CPR, daddy bootcamp, etc.)  Call the office [phone]) or go to [facility] if: You begin to have strong, frequent contractions Your water breaks.  Sometimes it is a big gush of fluid, sometimes it is just a trickle that keeps getting your panties wet or running down your legs You have vaginal bleeding.  It is normal to have a small amount of spotting if your cervix was checked.  You don't feel your baby moving like normal.  If you don't, get you something to eat and drink and lay down and focus on feeling your baby move.   If your baby is still not moving like normal, you should call the office or go to [facility].  Call the office [phone]) or go to [facility] hospital for these signs of pre-eclampsia: Severe headache that does not go away with Tylenol Visual changes- seeing spots, double, blurred vision Pain under your right breast or upper abdomen that does not go away with Tums or heartburn medicine Nausea and/or vomiting Severe swelling in your hands, feet, and face   Tdap Vaccine It is recommended that you get the Tdap vaccine during the third trimester of EACH pregnancy to help protect your baby from getting pertussis (whooping cough) 27-36 weeks is the BEST time to do  this so that you can pass the protection on to your baby. During pregnancy is better than after pregnancy, but if you are unable to get it during pregnancy it will be offered at the hospital.  You can get this vaccine with [place], at the health department, your family doctor, or some local pharmacies Everyone who will be around your baby should also be up-to-date on their vaccines before the baby comes. Adults (who are not pregnant) only need 1 dose of Tdap during adulthood.   [facility] Pediatricians/Family Doctors [facility]): [address] Colette Ribas, [phone]           [facility]: [address], [phone]                [facility]): [address], [phone] (call to ask if accepting patients) [facility] Department: [address], [place], [phone]    [facility] Pediatricians/Family Doctors [facility]): [phone], [phone] [facility]: [address] [place], [phone] [facility]: [address], [phone]  [facility] Doctors  [facility] [place]): [phone] [facility]: [address] [phone]   [facility] Doctors [facility]: [address] [phone]  [facility] Family Doctors  [facility]: [phone], [phone]  Home Blood Pressure Monitoring for Patients   Your provider has recommended that you check your  blood pressure (BP) at least once a week at home. If you do not have a blood pressure cuff at home, one will be provided for you. Contact your provider if you have not received your monitor within 1 week.   Helpful Tips for Accurate Home Blood Pressure Checks  Don't smoke, exercise, or drink caffeine 30 minutes before checking your BP Use the restroom before checking your BP (a full bladder can raise your  pressure) Relax in a comfortable upright chair Feet on the ground Left arm resting comfortably on a flat surface at the level of your heart Legs uncrossed Back supported Sit quietly and don't talk Place the cuff on your bare arm Adjust snuggly, so that only two fingertips can fit between your skin and the top of the cuff Check 2 readings separated by at least one minute Keep a log of your BP readings For a visual, please reference this diagram: http://ccnc.care/bpdiagram  Provider Name: [facility]     Phone: [phone]  Zone 1: ALL CLEAR  Continue to monitor your symptoms:  BP reading is less than 140 (top number) or less than 90 (bottom number)  No right upper stomach pain No headaches or seeing spots No feeling nauseated or throwing up No swelling in face and hands  Zone 2: CAUTION Call your doctor's office for any of the following:  BP reading is greater than 140 (top number) or greater than 90 (bottom number)  Stomach pain under your ribs in the middle or right side Headaches or seeing spots Feeling nauseated or throwing up Swelling in face and hands  Zone 3: EMERGENCY  Seek immediate medical care if you have any of the following:  BP reading is greater than160 (top number) or greater than 110 (bottom number) Severe headaches not improving with Tylenol Serious difficulty catching your breath Any worsening symptoms from Zone 2  Preterm Labor and Birth Information  The normal length of a pregnancy is 39-41 weeks. Preterm labor is when labor starts before 37 completed weeks of pregnancy. What are the risk factors for preterm labor? Preterm labor is more likely to occur in women who: Have certain infections during pregnancy such as a bladder infection, sexually transmitted infection, or infection inside the uterus (chorioamnionitis). Have a shorter-than-normal cervix. Have gone into preterm labor before. Have had surgery on their cervix. Are younger than age 17  or older than age 35. Are African American. Are pregnant with twins or multiple babies (multiple gestation). Take street drugs or smoke while pregnant. Do not gain enough weight while pregnant. Became pregnant shortly after having been pregnant. What are the symptoms of preterm labor? Symptoms of preterm labor include: Cramps similar to those that can happen during a menstrual period. The cramps may happen with diarrhea. Pain in the abdomen or lower back. Regular uterine contractions that may feel like tightening of the abdomen. A feeling of increased pressure in the pelvis. Increased watery or bloody mucus discharge from the vagina. Water breaking (ruptured amniotic sac). Why is it important to recognize signs of preterm labor? It is important to recognize signs of preterm labor because babies who are born prematurely may not be fully developed. This can put them at an increased risk for: Long-term (chronic) heart and lung problems. Difficulty immediately after birth with regulating body systems, including blood sugar, body temperature, heart rate, and breathing rate. Bleeding in the brain. Cerebral palsy. Learning difficulties. Death. These risks are highest for babies who are born before 34 weeks   of pregnancy. How is preterm labor treated? Treatment depends on the length of your pregnancy, your condition, and the health of your baby. It may involve: Having a stitch (suture) placed in your cervix to prevent your cervix from opening too early (cerclage). Taking or being given medicines, such as: Hormone medicines. These may be given early in pregnancy to help support the pregnancy. Medicine to stop contractions. Medicines to help mature the baby's lungs. These may be prescribed if the risk of delivery is high. Medicines to prevent your baby from developing cerebral palsy. If the labor happens before 34 weeks of pregnancy, you may need to stay in the hospital. What should I do if I  think I am in preterm labor? If you think that you are going into preterm labor, call your health care provider right away. How can I prevent preterm labor in future pregnancies? To increase your chance of having a full-term pregnancy: Do not use any tobacco products, such as cigarettes, chewing tobacco, and e-cigarettes. If you need help quitting, ask your health care provider. Do not use street drugs or medicines that have not been prescribed to you during your pregnancy. Talk with your health care provider before taking any herbal supplements, even if you have been taking them regularly. Make sure you gain a healthy amount of weight during your pregnancy. Watch for infection. If you think that you might have an infection, get it checked right away. Make sure to tell your health care provider if you have gone into preterm labor before. This information is not intended to replace advice given to you by your health care provider. Make sure you discuss any questions you have with your health care provider. Document Revised: 11/08/2018 Document Reviewed: 12/08/2015 Elsevier Patient Education  [place]? Guide for patients at Center for [employer] [facility]) Why consider waterbirth? Gentle birth for babies  Less pain medicine used in labor  May allow for passive descent/less pushing  May reduce perineal tears  More mobility and instinctive maternal position changes  Increased maternal relaxation   Is waterbirth safe? What are the risks of infection, drowning or other complications? Infection:  Very low risk (3.7 % for tub vs 4.8% for bed)  7 in 8000 waterbirths with documented infection  Poorly cleaned equipment most common cause  Slightly lower group B strep transmission rate  Drowning  Maternal:  Very low risk  Related to seizures or fainting  Newborn:  Very low risk. No evidence of increased risk of respiratory problems in multiple large  studies  Physiological protection from breathing under water  Avoid underwater birth if there are any fetal complications  Once baby's head is out of the water, keep it out.  Birth complication  Some reports of cord trauma, but risk decreased by bringing baby to surface gradually  No evidence of increased risk of shoulder dystocia. Mothers can usually change positions faster in water than in a bed, possibly aiding the maneuvers to free the shoulder.   There are 2 things you MUST do to have a waterbirth with [facility]: Attend a waterbirth class at [place] at [facility]   3rd Wednesday of every month from 7-9 pm (virtual during [place]) [employer] at [email or web address] or [email or web address] or by calling [phone] Bring [place] the certificate from the class to your prenatal appointment or send via [place] with a midwife at 36 weeks* to see if you can still plan a waterbirth and  to sign the consent.   *We also recommend that you schedule as many of your prenatal visits with a midwife as possible.    Helpful information: You may want to bring a bathing suit top to the hospital to wear during labor but this is optional.  All other supplies are provided by the hospital. Please arrive at the hospital with signs of active labor, and do not wait at home until late in labor. It takes 45 min- 2 hours for COVID testing, fetal monitoring, and check in to your room to take place, plus transport and filling of the waterbirth tub.    Things that would prevent you from having a waterbirth: Unknown or Positive COVID-19 diagnosis upon admission to hospital* Premature, <37wks  Previous cesarean birth  Presence of thick meconium-stained fluid  Multiple gestation (Twins, triplets, etc.)  Uncontrolled diabetes or gestational diabetes requiring medication  Hypertension diagnosed in pregnancy or preexisting hypertension (gestational hypertension, preeclampsia, or chronic  hypertension) Fetal growth restriction (your baby measures less than 10th percentile on ultrasound) Heavy vaginal bleeding  Non-reassuring fetal heart rate  Active infection (MRSA, etc.). Group B Strep is NOT a contraindication for waterbirth.  If your labor has to be induced and induction method requires continuous monitoring of the baby's heart rate  Other risks/issues identified by your obstetrical provider   Please remember that birth is unpredictable. Under certain unforeseeable circumstances your provider may advise against giving birth in the tub. These decisions will be made on a case-by-case basis and with the safety of you and your baby as our highest priority.   *Please remember that in order to have a waterbirth, you must test Negative to COVID-19 upon admission to the hospital.  Updated 11/08/20

## 2021-05-24 NOTE — Progress Notes (Signed)
    LOW-RISK PREGNANCY VISIT Patient name: Sarah Nichols MRN [id number]  Date of birth: 12/04/1995 Chief Complaint:   Routine Prenatal Visit (GBS, GC/CHL)  History of Present Illness:   Sarah Nichols is a 25 y.o. G63P0020 female at [redacted]w[redacted]d with an Estimated Date of Delivery: 06/19/21 being seen today for ongoing management of a low-risk pregnancy.   Today she reports no complaints. Contractions: Irritability. Vag. Bleeding: None.  Movement: Present. denies leaking of fluid.  Depression screen [facility] 2/9 03/29/2021 12/08/2020  Decreased Interest 1 1  Down, Depressed, Hopeless 1 1  PHQ - 2 Score 2 2  Altered sleeping 2 2  Tired, decreased energy 2 3  Change in appetite 1 1  Feeling bad or failure about yourself  1 1  Trouble concentrating 1 2  Moving slowly or fidgety/restless 0 3  Suicidal thoughts 0 0  PHQ-9 Score 9 14     GAD 7 : Generalized Anxiety Score 03/29/2021 12/08/2020  Nervous, Anxious, on Edge 2 3  Control/stop worrying 2 2  Worry too much - different things 1 2  Trouble relaxing 1 1  Restless 2 2  Easily annoyed or irritable 1 2  Afraid - awful might happen 2 2  Total GAD 7 Score 11 14      Review of Systems:   Pertinent items are noted in HPI Denies abnormal vaginal discharge w/ itching/odor/irritation, headaches, visual changes, shortness of breath, chest pain, abdominal pain, severe nausea/vomiting, or problems with urination or bowel movements unless otherwise stated above. Pertinent History Reviewed:  Reviewed past medical,surgical, social, obstetrical and family history.  Reviewed problem list, medications and allergies. Physical Assessment:   Vitals:   05/24/21 1610  BP: 107/79  Pulse: 83  Weight: 212 lb 8 oz (96.4 kg)  Body mass index is 36.48 kg/m.        Physical Examination:   General appearance: Well appearing, and in no distress  Mental status: Alert, oriented to person, place, and time  Skin: Warm & dry  Cardiovascular:  Normal heart rate noted  Respiratory: Normal respiratory effort, no distress  Abdomen: Soft, gravid, nontender  Pelvic: Cervical exam deferred  Dilation: 1 Effacement (%): 80 Station: -2  Extremities: Edema: Trace  Fetal Status: Fetal Heart Rate (bpm): 152 Fundal Height: 36 cm Movement: Present Presentation: Vertex  Chaperone: Malachy Mood   No results found for this or any previous visit (from the past 24 hour(s)).  Assessment & Plan:  1) Low-risk pregnancy G3P0020 at [redacted]w[redacted]d with an Estimated Date of Delivery: 06/19/21   2) Wants waterbirth, has taken class, reviewed risks/benefits, signed consent today   Meds: No orders of the defined types were placed in this encounter.  Labs/procedures today: GBS, GC/CT, and SVE  Plan:  Continue routine obstetrical care  Next visit: prefers in person    Reviewed: Preterm labor symptoms and general obstetric precautions including but not limited to vaginal bleeding, contractions, leaking of fluid and fetal movement were reviewed in detail with the patient.  All questions were answered. Does have home bp cuff. Office bp cuff given: not applicable. Check bp weekly, let us know if consistently >140 and/or >90.  Follow-up: Return for weekly, CNM, in person.  No future appointments.  Orders Placed This Encounter  Procedures   Strep Gp B NAA   Cheral Marker CNM, [facility] 05/24/2021 4:31 PM

## 2021-05-25 DIAGNOSIS — F4325 Adjustment disorder with mixed disturbance of emotions and conduct: Secondary | ICD-10-CM | POA: Diagnosis not present

## 2021-05-26 LAB — STREP GP B NAA: Strep Gp B NAA: NEGATIVE

## 2021-05-26 LAB — CERVICOVAGINAL ANCILLARY ONLY
Chlamydia: NEGATIVE
Comment: NEGATIVE
Comment: NORMAL
Neisseria Gonorrhea: NEGATIVE

## 2021-05-27 DIAGNOSIS — F4325 Adjustment disorder with mixed disturbance of emotions and conduct: Secondary | ICD-10-CM | POA: Diagnosis not present

## 2021-05-29 ENCOUNTER — Inpatient Hospital Stay (HOSPITAL_COMMUNITY)
Admission: AD | Admit: 2021-05-29 | Discharge: 2021-05-29 | Disposition: A | Discharge status: Home / Self Care | Payer: BC Managed Care – PPO | Attending: Obstetrics and Gynecology | Admitting: Obstetrics and Gynecology

## 2021-05-29 ENCOUNTER — Inpatient Hospital Stay (HOSPITAL_BASED_OUTPATIENT_CLINIC_OR_DEPARTMENT_OTHER): Payer: BC Managed Care – PPO

## 2021-05-29 ENCOUNTER — Other Ambulatory Visit: Payer: Self-pay

## 2021-05-29 ENCOUNTER — Encounter (HOSPITAL_COMMUNITY): Payer: Self-pay | Admitting: Obstetrics and Gynecology

## 2021-05-29 DIAGNOSIS — E669 Obesity, unspecified: Secondary | ICD-10-CM | POA: Diagnosis not present

## 2021-05-29 DIAGNOSIS — W010XXA Fall on same level from slipping, tripping and stumbling without subsequent striking against object, initial encounter: Secondary | ICD-10-CM | POA: Diagnosis not present

## 2021-05-29 DIAGNOSIS — Z3A37 37 weeks gestation of pregnancy: Secondary | ICD-10-CM | POA: Diagnosis not present

## 2021-05-29 DIAGNOSIS — O9A213 Injury, poisoning and certain other consequences of external causes complicating pregnancy, third trimester: Secondary | ICD-10-CM

## 2021-05-29 DIAGNOSIS — O99212 Obesity complicating pregnancy, second trimester: Secondary | ICD-10-CM

## 2021-05-29 DIAGNOSIS — Z79899 Other long term (current) drug therapy: Secondary | ICD-10-CM | POA: Diagnosis not present

## 2021-05-29 DIAGNOSIS — T1490XA Injury, unspecified, initial encounter: Secondary | ICD-10-CM | POA: Diagnosis not present

## 2021-05-29 DIAGNOSIS — O368130 Decreased fetal movements, third trimester, not applicable or unspecified: Secondary | ICD-10-CM | POA: Diagnosis not present

## 2021-05-29 DIAGNOSIS — Y9289 Other specified places as the place of occurrence of the external cause: Secondary | ICD-10-CM | POA: Insufficient documentation

## 2021-05-29 DIAGNOSIS — W19XXXA Unspecified fall, initial encounter: Secondary | ICD-10-CM | POA: Diagnosis not present

## 2021-05-29 DIAGNOSIS — Z3689 Encounter for other specified antenatal screening: Secondary | ICD-10-CM | POA: Diagnosis not present

## 2021-05-29 DIAGNOSIS — Y939 Activity, unspecified: Secondary | ICD-10-CM | POA: Diagnosis not present

## 2021-05-29 NOTE — MAU Provider Note (Signed)
History     CSN: [id number]  Arrival date and time: 05/29/21 1430   Event Date/Time   First Provider Initiated Contact with Patient 05/29/21 1517      Chief Complaint  Patient presents with   Fall   Decreased Fetal Movement   Ms. Wanna Gully is a 25 y.o. year old G62P0020 female at [redacted]w[redacted]d weeks gestation who presents to MAU reporting she got on a slide at a fall festival. She reported "when I was going down the slide, I felt like I was going faster than everyone else." She states "once I got to the bottom, I tried to pull my legs underneath me, but ended up on my knees and falling forward hitting my abdomen." Since the fall she has had DFM. She does report feeling good FM since being on the monitors here. She states "she is fighting the monitors on my belly." She reports soreness at the place on her abdomen that she fell on. She denies VB or LOF. She receives [facility] with [facility]. She was dilated 1.5 cm and 80% effaced on Tuesday 05/24/2021. Her husband, Onalee Hua, is present and contributing to the history taking.    OB History     Gravida  3   Para      Term      Preterm      AB  2   Living         SAB  2   IAB      Ectopic      Multiple      Live Births              Past Medical History:  Diagnosis Date   Ovarian cyst    PTSD (post-traumatic stress disorder)     Past Surgical History:  Procedure Laterality Date   DILATION AND EVACUATION N/A 03/21/2019   Procedure: DILATATION AND EVACUATION;  Surgeon: Linzie Collin, MD;  Location: [facility] ORS;  Service: Gynecology;  Laterality: N/A;   KNEE ARTHROSCOPY WITH ANTERIOR CRUCIATE LIGAMENT (ACL) REPAIR WITH HAMSTRING GRAFT     no history      Family History  Problem Relation Age of Onset   Cervical cancer Mother    Depression Mother    Anxiety disorder Mother    Hypertension Father    Gallstones Paternal Grandmother    Cancer Paternal Grandmother        skin    Social History   Tobacco  Use   Smoking status: Never   Smokeless tobacco: Never  Vaping Use   Vaping Use: Never used  Substance Use Topics   Alcohol use: Not Currently   Drug use: Not Currently    Types: Marijuana    Allergies: No Known Allergies  Medications Prior to Admission  Medication Sig Dispense Refill Last Dose   acetaminophen (TYLENOL) 500 MG tablet Take 1,000 mg by mouth every 6 (six) hours as needed (for pain.).   05/28/2021   pantoprazole (PROTONIX) 20 MG tablet Take 1 tablet (20 mg total) by mouth daily. 30 tablet 1 05/28/2021   prenatal vitamin w/FE, FA (PRENATAL 1 + 1) 27-1 MG TABS tablet Take 1 tablet by mouth daily at 12 noon.   05/28/2021   sertraline (ZOLOFT) 100 MG tablet Take 1 tablet (100 mg total) by mouth daily. 90 tablet 3 05/28/2021    Review of Systems  Constitutional: Negative.   HENT: Negative.    Eyes: Negative.   Respiratory: Negative.    Cardiovascular: Negative.  Gastrointestinal:        Sore where abdomen was hit  Endocrine: Negative.   Genitourinary:  Negative for vaginal bleeding.       DFM  Musculoskeletal: Negative.   Skin: Negative.   Allergic/Immunologic: Negative.   Neurological: Negative.   Hematological: Negative.   Psychiatric/Behavioral: Negative.     Physical Exam   Blood pressure 121/76, pulse 87, temperature 98.8 F (37.1 C), temperature source Oral, resp. rate 15, last menstrual period 09/12/2020, SpO2 98 %, unknown if currently breastfeeding.  Physical Exam Vitals and nursing note reviewed.  Constitutional:      Appearance: Normal appearance. She is obese.  Cardiovascular:     Rate and Rhythm: Normal rate.  Pulmonary:     Effort: Pulmonary effort is normal.  Abdominal:     Palpations: Abdomen is soft.  Genitourinary:    Comments: Dilation: 1.5 Effacement (%): 70 Cervical Position: Middle Station: -3 Presentation: Vertex Exam by: Carloyn Jaeger, CNM  Musculoskeletal:        General: Normal range of motion.  Skin:    General: Skin is  warm and dry.  Neurological:     Mental Status: She is alert and oriented to person, place, and time.  Psychiatric:        Mood and Affect: Mood normal.        Behavior: Behavior normal.        Thought Content: Thought content normal.        Judgment: Judgment normal.   REACTIVE NST - FHR: 130 bpm / moderate variability / accels present / decels absent / TOCO: irregular UC's with UI noted  MAU Course  Procedures  MDM EFM OB Limited MFM U/S    Assessment and Plan  Traumatic injury during pregnancy in third trimester  - Return to MAU: If you have heavy bleeding that soaks through more that 2 pads per hour for an hour or more If you bleed so much that you feel like you might pass out or you do pass out If you have significant abdominal pain that is not improved with Tylenol 1000 mg every 8 hours as needed for pain Decreased or no fetal movement If you develop a fever > 100.5  - Information provided on [facility]   NST (non-stress test) reactive on fetal surveillance  - Reassurance given fetal well-being is normal  [redacted] weeks gestation of pregnancy  - Discharge home - Keep scheduled appt with [facility] on Tuesday 05/31/21 - Patient verbalized an understanding of the plan of care and agrees.    Raelyn Mora, CNM 05/29/2021, 3:34 PM

## 2021-05-29 NOTE — Discharge Instructions (Signed)
Return to MAU: If you have heavy bleeding that soaks through more that 2 pads per hour for an hour or more If you bleed so much that you feel like you might pass out or you do pass out If you have significant abdominal pain that is not improved with Tylenol 1000 mg every 8 hours as needed for pain Decreased or no fetal movement If you develop a fever > 100.5

## 2021-05-29 NOTE — MAU Note (Signed)
Pt reports to mau with c/o DFM after falling down a slide about 1 hour ago.  Pt reports the slide went to fast and she flipped forward and hit her abd.  Pt denies vag bleeding or LOF.  Reports some soreness where she hit her abd.  FHR 142 in triage

## 2021-05-31 ENCOUNTER — Encounter: Payer: Self-pay | Admitting: Obstetrics & Gynecology

## 2021-05-31 ENCOUNTER — Ambulatory Visit (INDEPENDENT_AMBULATORY_CARE_PROVIDER_SITE_OTHER): Payer: BC Managed Care – PPO | Admitting: Obstetrics & Gynecology

## 2021-05-31 ENCOUNTER — Other Ambulatory Visit: Payer: Self-pay

## 2021-05-31 VITALS — BP 110/77 | HR 98 | Wt 208.5 lb

## 2021-05-31 DIAGNOSIS — Z3480 Encounter for supervision of other normal pregnancy, unspecified trimester: Secondary | ICD-10-CM

## 2021-05-31 NOTE — Progress Notes (Signed)
   LOW-RISK PREGNANCY VISIT Patient name: Sarah Nichols MRN [id number]  Date of birth: 04-May-1996 Chief Complaint:   Routine Prenatal Visit (Vomiting and diarrhea last night; + cramps; watery discharge)  History of Present Illness:   Sarah Nichols is a 25 y.o. G68P0020 female at [redacted]w[redacted]d with an Estimated Date of Delivery: 06/19/21 being seen today for ongoing management of a low-risk pregnancy.  Depression screen [facility] 2/9 03/29/2021 12/08/2020  Decreased Interest 1 1  Down, Depressed, Hopeless 1 1  PHQ - 2 Score 2 2  Altered sleeping 2 2  Tired, decreased energy 2 3  Change in appetite 1 1  Feeling bad or failure about yourself  1 1  Trouble concentrating 1 2  Moving slowly or fidgety/restless 0 3  Suicidal thoughts 0 0  PHQ-9 Score 9 14    Today she reports no complaints. Contractions: Irregular. Vag. Bleeding: None.  Movement: Present. denies leaking of fluid. Review of Systems:   Pertinent items are noted in HPI Denies abnormal vaginal discharge w/ itching/odor/irritation, headaches, visual changes, shortness of breath, chest pain, abdominal pain, severe nausea/vomiting, or problems with urination or bowel movements unless otherwise stated above. Pertinent History Reviewed:  Reviewed past medical,surgical, social, obstetrical and family history.  Reviewed problem list, medications and allergies. Physical Assessment:   Vitals:   05/31/21 1359  BP: 110/77  Pulse: 98  Weight: 208 lb 8 oz (94.6 kg)  Body mass index is 35.79 kg/m.        Physical Examination:   General appearance: Well appearing, and in no distress  Mental status: Alert, oriented to person, place, and time  Skin: Warm & dry  Cardiovascular: Normal heart rate noted  Respiratory: Normal respiratory effort, no distress  Abdomen: Soft, gravid, nontender  Pelvic: Cervical exam performed  Dilation: 3 Effacement (%): 50 Station: -2  Extremities: Edema: None  Fetal Status: Fetal Heart Rate (bpm):  144 Fundal Height: 36 cm Movement: Present Presentation: Vertex  Chaperone: Malachy Mood    No results found for this or any previous visit (from the past 24 hour(s)).  Assessment & Plan:  1) Low-risk pregnancy G3P0020 at [redacted]w[redacted]d with an Estimated Date of Delivery: 06/19/21      Meds: No orders of the defined types were placed in this encounter.  Labs/procedures today:   Plan:  Continue routine obstetrical care  Next visit: prefers in person    Reviewed: Term labor symptoms and general obstetric precautions including but not limited to vaginal bleeding, contractions, leaking of fluid and fetal movement were reviewed in detail with the patient.  All questions were answered.  home bp cuff. Rx faxed to . Check bp weekly, let us know if >140/90.   Follow-up: Return in about 1 week (around 06/07/2021) for LROB.  No orders of the defined types were placed in this encounter.   Lazaro Arms, MD 05/31/2021 2:48 PM

## 2021-06-01 DIAGNOSIS — F4325 Adjustment disorder with mixed disturbance of emotions and conduct: Secondary | ICD-10-CM | POA: Diagnosis not present

## 2021-06-03 DIAGNOSIS — F4325 Adjustment disorder with mixed disturbance of emotions and conduct: Secondary | ICD-10-CM | POA: Diagnosis not present

## 2021-06-07 ENCOUNTER — Ambulatory Visit (INDEPENDENT_AMBULATORY_CARE_PROVIDER_SITE_OTHER): Payer: BC Managed Care – PPO | Admitting: Women's Health

## 2021-06-07 ENCOUNTER — Other Ambulatory Visit: Payer: Self-pay

## 2021-06-07 ENCOUNTER — Encounter: Payer: Self-pay | Admitting: Women's Health

## 2021-06-07 VITALS — BP 126/75 | HR 87 | Wt 212.0 lb

## 2021-06-07 DIAGNOSIS — Z3483 Encounter for supervision of other normal pregnancy, third trimester: Secondary | ICD-10-CM

## 2021-06-07 NOTE — Progress Notes (Signed)
    LOW-RISK PREGNANCY VISIT Patient name: Sarah Nichols MRN [id number]  Date of birth: 02/12/1996 Chief Complaint:   Routine Prenatal Visit  History of Present Illness:   Sarah Nichols is a 25 y.o. G7P0020 female at [redacted]w[redacted]d with an Estimated Date of Delivery: 06/19/21 being seen today for ongoing management of a low-risk pregnancy.   Today she reports no complaints. Contractions: Irregular. Vag. Bleeding: None.  Movement: Present. denies leaking of fluid.  Depression screen [facility] 2/9 03/29/2021 12/08/2020  Decreased Interest 1 1  Down, Depressed, Hopeless 1 1  PHQ - 2 Score 2 2  Altered sleeping 2 2  Tired, decreased energy 2 3  Change in appetite 1 1  Feeling bad or failure about yourself  1 1  Trouble concentrating 1 2  Moving slowly or fidgety/restless 0 3  Suicidal thoughts 0 0  PHQ-9 Score 9 14     GAD 7 : Generalized Anxiety Score 03/29/2021 12/08/2020  Nervous, Anxious, on Edge 2 3  Control/stop worrying 2 2  Worry too much - different things 1 2  Trouble relaxing 1 1  Restless 2 2  Easily annoyed or irritable 1 2  Afraid - awful might happen 2 2  Total GAD 7 Score 11 14      Review of Systems:   Pertinent items are noted in HPI Denies abnormal vaginal discharge w/ itching/odor/irritation, headaches, visual changes, shortness of breath, chest pain, abdominal pain, severe nausea/vomiting, or problems with urination or bowel movements unless otherwise stated above. Pertinent History Reviewed:  Reviewed past medical,surgical, social, obstetrical and family history.  Reviewed problem list, medications and allergies. Physical Assessment:   Vitals:   06/07/21 1115  BP: 126/75  Pulse: 87  Weight: 212 lb (96.2 kg)  Body mass index is 36.39 kg/m.        Physical Examination:   General appearance: Well appearing, and in no distress  Mental status: Alert, oriented to person, place, and time  Skin: Warm & dry  Cardiovascular: Normal heart rate  noted  Respiratory: Normal respiratory effort, no distress  Abdomen: Soft, gravid, nontender  Pelvic: Cervical exam performed  Dilation: 3 Effacement (%): 80 Station: -2  Extremities: Edema: None  Fetal Status: Fetal Heart Rate (bpm): 143 Fundal Height: 37 cm Movement: Present Presentation: Vertex  Chaperone: Latisha Cresenzo   No results found for this or any previous visit (from the past 24 hour(s)).  Assessment & Plan:  1) Low-risk pregnancy G3P0020 at [redacted]w[redacted]d with an Estimated Date of Delivery: 06/19/21   2) Wants waterbirth, class/consent done   Meds: No orders of the defined types were placed in this encounter.  Labs/procedures today: SVE  Plan:  Continue routine obstetrical care  Next visit: prefers in person    Reviewed: Term labor symptoms and general obstetric precautions including but not limited to vaginal bleeding, contractions, leaking of fluid and fetal movement were reviewed in detail with the patient.  All questions were answered. Does have home bp cuff. Office bp cuff given: not applicable. Check bp weekly, let us know if consistently >140 and/or >90.  Follow-up: Return for weekly, CNM, in person.  Future Appointments  Date Time Provider Department Center  06/14/2021  9:50 AM Cheral Marker, CNM [facility]    No orders of the defined types were placed in this encounter.  Cheral Marker CNM, [facility] 06/07/2021 11:44 AM

## 2021-06-07 NOTE — Patient Instructions (Signed)
Sarah Nichols, thank you for choosing our office today! We appreciate the opportunity to meet your healthcare needs. You may receive a short survey by mail, e-mail, or through MyChart. If you are happy with your care we would appreciate if you could take just a few minutes to complete the survey questions. We read all of your comments and take your feedback very seriously. Thank you again for choosing our office.  [facility] ([address] [place], [place] [address]) Entrance C, located off of [address] Free 24/7 valet parking   CLASSES: Go to [email or web address] to register for classes (childbirth, breastfeeding, waterbirth, infant CPR, daddy bootcamp, etc.)  Call the office ([phone]) or go to [facility] if: You begin to have strong, frequent contractions Your water breaks.  Sometimes it is a big gush of fluid, sometimes it is just a trickle that keeps getting your panties wet or running down your legs You have vaginal bleeding.  It is normal to have a small amount of spotting if your cervix was checked.  You don't feel your baby moving like normal.  If you don't, get you something to eat and drink and lay down and focus on feeling your baby move.   If your baby is still not moving like normal, you should call the office or go to [facility].  Call the office ([phone]) or go to [facility] for these signs of pre-eclampsia: Severe headache that does not go away with Tylenol Visual changes- seeing spots, double, blurred vision Pain under your right breast or upper abdomen that does not go away with Tums or heartburn medicine Nausea and/or vomiting Severe swelling in your hands, feet, and face   [place] Pediatricians/Family Doctors [facility] ([facility]): [address], [phone]           [facility]: [address], [phone]                 [facility] ([facility]): [address], [phone] (call to ask if accepting patients) [facility]: [address], [place], [phone]    [place] Pediatricians/Family Doctors [facility] ([facility]): [address], [phone] [facility]: [address], [phone] [facility]: [address], [phone]  [place] [facility]  [facility] ([facility]): [phone] [facility]: [address], [phone]   [place] [facility]: [address], [phone]  [place] [facility]  [facility]: [phone], [phone]  Home Blood Pressure Monitoring for Patients   Your provider has recommended that you check your blood pressure (BP) at least once a week at home. If you do not have a blood pressure cuff at home, one will be provided for you. Contact your provider if you have not received your monitor within 1 week.   Helpful Tips for Accurate Home Blood Pressure Checks  Don't smoke, exercise, or drink caffeine 30 minutes before checking your BP Use the restroom before checking your BP (a full bladder can raise your pressure) Relax in a comfortable upright chair Feet on the ground Left arm resting comfortably on a flat surface at the level of your heart Legs uncrossed Back supported Sit quietly and don't talk Place the cuff on your bare arm Adjust snuggly, so that only two fingertips   can fit between your skin and the top of the cuff Check 2 readings separated by at least one minute Keep a log of your BP readings For a visual, please reference this diagram: http://[facility].care/bpdiagram  Provider Name: [facility]     Phone: [phone]  Zone 1: ALL CLEAR  Continue to monitor your symptoms:  BP reading is less than 140 (top number) or less than 90 (bottom number)  No right  upper stomach pain No headaches or seeing spots No feeling nauseated or throwing up No swelling in face and hands  Zone 2: CAUTION Call your doctor's office for any of the following:  BP reading is greater than 140 (top number) or greater than 90 (bottom number)  Stomach pain under your ribs in the middle or right side Headaches or seeing spots Feeling nauseated or throwing up Swelling in face and hands  Zone 3: EMERGENCY  Seek immediate medical care if you have any of the following:  BP reading is greater than160 (top number) or greater than 110 (bottom number) Severe headaches not improving with Tylenol Serious difficulty catching your breath Any worsening symptoms from Zone 2   Braxton Hicks Contractions Contractions of the uterus can occur throughout pregnancy, but they are not always a sign that you are in labor. You may have practice contractions called Braxton Hicks contractions. These false labor contractions are sometimes confused with true labor. What are Braxton Hicks contractions? Braxton Hicks contractions are tightening movements that occur in the muscles of the uterus before labor. Unlike true labor contractions, these contractions do not result in opening (dilation) and thinning of the cervix. Toward the end of pregnancy (32-34 weeks), Braxton Hicks contractions can happen more often and may become stronger. These contractions are sometimes difficult to tell apart from true labor because they can be very uncomfortable. You should not feel embarrassed if you go to the hospital with false labor. Sometimes, the only way to tell if you are in true labor is for your health care provider to look for changes in the cervix. The health care provider will do a physical exam and may monitor your contractions. If you are not in true labor, the exam should show that your cervix is not dilating and your water has not broken. If there are no other health problems associated with your  pregnancy, it is completely safe for you to be sent home with false labor. You may continue to have Braxton Hicks contractions until you go into true labor. How to tell the difference between true labor and false labor True labor Contractions last 30-70 seconds. Contractions become very regular. Discomfort is usually felt in the top of the uterus, and it spreads to the lower abdomen and low back. Contractions do not go away with walking. Contractions usually become more intense and increase in frequency. The cervix dilates and gets thinner. False labor Contractions are usually shorter and not as strong as true labor contractions. Contractions are usually irregular. Contractions are often felt in the front of the lower abdomen and in the groin. Contractions may go away when you walk around or change positions while lying down. Contractions get weaker and are shorter-lasting as time goes on. The cervix usually does not dilate or become thin. Follow these instructions at home:  Take over-the-counter and prescription medicines only as told by your health care provider. Keep up with your usual exercises and follow other instructions from your health care provider. Eat and drink lightly if you think   you are going into labor. If Braxton Hicks contractions are making you uncomfortable: Change your position from lying down or resting to walking, or change from walking to resting. Sit and rest in a tub of warm water. Drink enough fluid to keep your urine pale yellow. Dehydration may cause these contractions. Do slow and deep breathing several times an hour. Keep all follow-up prenatal visits as told by your health care provider. This is important. Contact a health care provider if: You have a fever. You have continuous pain in your abdomen. Get help right away if: Your contractions become stronger, more regular, and closer together. You have fluid leaking or gushing from your vagina. You pass  blood-tinged mucus (bloody show). You have bleeding from your vagina. You have low back pain that you never had before. You feel your baby's head pushing down and causing pelvic pressure. Your baby is not moving inside you as much as it used to. Summary Contractions that occur before labor are called Braxton Hicks contractions, false labor, or practice contractions. Braxton Hicks contractions are usually shorter, weaker, farther apart, and less regular than true labor contractions. True labor contractions usually become progressively stronger and regular, and they become more frequent. Manage discomfort from Braxton Hicks contractions by changing position, resting in a warm bath, drinking plenty of water, or practicing deep breathing. This information is not intended to replace advice given to you by your health care provider. Make sure you discuss any questions you have with your health care provider. Document Revised: 06/29/2017 Document Reviewed: 11/30/2016 Elsevier Patient Education  2020 Elsevier Inc[employer]   

## 2021-06-08 DIAGNOSIS — F4325 Adjustment disorder with mixed disturbance of emotions and conduct: Secondary | ICD-10-CM | POA: Diagnosis not present

## 2021-06-10 DIAGNOSIS — F4325 Adjustment disorder with mixed disturbance of emotions and conduct: Secondary | ICD-10-CM | POA: Diagnosis not present

## 2021-06-14 ENCOUNTER — Encounter (HOSPITAL_COMMUNITY): Payer: Self-pay | Admitting: Obstetrics and Gynecology

## 2021-06-14 ENCOUNTER — Other Ambulatory Visit: Payer: Self-pay

## 2021-06-14 ENCOUNTER — Encounter: Payer: BC Managed Care – PPO | Admitting: Women's Health

## 2021-06-14 ENCOUNTER — Inpatient Hospital Stay (HOSPITAL_COMMUNITY)
Admission: AD | Admit: 2021-06-14 | Discharge: 2021-06-15 | Disposition: A | Discharge status: Home / Self Care | Payer: BC Managed Care – PPO | Attending: Obstetrics and Gynecology | Admitting: Obstetrics and Gynecology

## 2021-06-14 DIAGNOSIS — Z3A39 39 weeks gestation of pregnancy: Secondary | ICD-10-CM

## 2021-06-14 DIAGNOSIS — O471 False labor at or after 37 completed weeks of gestation: Secondary | ICD-10-CM

## 2021-06-14 NOTE — MAU Provider Note (Signed)
S: Ms. Heaven Meeker is a 25 y.o. G3P0020 at [redacted]w[redacted]d  who presents to MAU today complaining of irregular contractions. She denies vaginal bleeding. She denies LOF. She reports normal fetal movement.    O: BP 117/80 (BP Location: Right Arm)   Pulse 83   Temp 98.5 F (36.9 C)   Resp 18   Ht 5\' 4"  (1.626 m)   Wt 96.9 kg   LMP 09/12/2020   BMI 36.66 kg/m  GENERAL: Well-developed, well-nourished female in no acute distress.  HEAD: Normocephalic, atraumatic.  CHEST: Normal effort of breathing, regular heart rate ABDOMEN: Soft, nontender, gravid  Cervical exam:  Dilation: 3 Effacement (%): 50 Cervical Position: Middle Station: -3 Presentation: Vertex Exam by:: [email or web address], RN   Fetal Monitoring: Baseline: 120 Variability: moderate Accelerations: 15x15 Decelerations: none Contractions: 5-10   A: SIUP at [redacted]w[redacted]d  False labor  Patient planning waterbirth, unchanged from office exam and irregular UCs. Labor precautions reviewed and discharged home to await labor.   P: 1. False labor after 37 completed weeks of gestation   2. [redacted] weeks gestation of pregnancy    -Discharge home in stable condition -Labor precautions discussed -Patient advised to follow-up with OB as scheduled for prenatal care -Patient may return to MAU as needed or if her condition were to change or worsen   [redacted]w[redacted]d, Rolm Bookbinder 06/14/2021 10:26 PM

## 2021-06-14 NOTE — MAU Note (Signed)
PT SAYS [facility]  ARE REG - [facility] LAST Tuesday - 3 CM DENIES HSV  GBS-  NEG

## 2021-06-14 NOTE — Discharge Instructions (Signed)

## 2021-06-15 ENCOUNTER — Telehealth: Payer: Self-pay

## 2021-06-15 NOTE — Telephone Encounter (Signed)
Pt called stating that she thought her water had broken. When asked for clarification, she stated that she was on the bed talking to her husband and when she laughed, a small gush of fluid came out. She noticed a little more when she went to the bathroom. She denies anymore fluid leakage. She is having contractions every 3-5 min at a level 5-6/10, but pt has pleasant demeanor and is able to walk, talk, and breathe through them. She went to the hospital on 11/15 and her cervix was 3 cm dilated. She was offered an office visit today or the option of waiting to see how the day progressed. She stated that she would wait and continue monitoring her contractions. Pt told to go to MAU if she had continuous fluid leakage, bleeding, or decreased baby movements. Pt confirmed understanding.

## 2021-06-16 ENCOUNTER — Ambulatory Visit (INDEPENDENT_AMBULATORY_CARE_PROVIDER_SITE_OTHER): Payer: BC Managed Care – PPO | Admitting: Obstetrics & Gynecology

## 2021-06-16 ENCOUNTER — Encounter: Payer: Self-pay | Admitting: Obstetrics & Gynecology

## 2021-06-16 ENCOUNTER — Other Ambulatory Visit: Payer: Self-pay

## 2021-06-16 VITALS — BP 123/88 | HR 98 | Wt 214.0 lb

## 2021-06-16 DIAGNOSIS — Z3480 Encounter for supervision of other normal pregnancy, unspecified trimester: Secondary | ICD-10-CM

## 2021-06-16 NOTE — Progress Notes (Signed)
   LOW-RISK PREGNANCY VISIT Patient name: Sarah Nichols MRN [id number]  Date of birth: Dec 08, 1995 Chief Complaint:   Routine Prenatal Visit  History of Present Illness:   Sarah Nichols is a 25 y.o. G63P0020 female at [redacted]w[redacted]d with an Estimated Date of Delivery: 06/19/21 being seen today for ongoing management of a low-risk pregnancy.  Depression screen [facility] 2/9 03/29/2021 12/08/2020  Decreased Interest 1 1  Down, Depressed, Hopeless 1 1  PHQ - 2 Score 2 2  Altered sleeping 2 2  Tired, decreased energy 2 3  Change in appetite 1 1  Feeling bad or failure about yourself  1 1  Trouble concentrating 1 2  Moving slowly or fidgety/restless 0 3  Suicidal thoughts 0 0  PHQ-9 Score 9 14    Today she reports no complaints. Contractions: Irregular. Vag. Bleeding: None.  Movement: Present. denies leaking of fluid. Review of Systems:   Pertinent items are noted in HPI Denies abnormal vaginal discharge w/ itching/odor/irritation, headaches, visual changes, shortness of breath, chest pain, abdominal pain, severe nausea/vomiting, or problems with urination or bowel movements unless otherwise stated above. Pertinent History Reviewed:  Reviewed past medical,surgical, social, obstetrical and family history.  Reviewed problem list, medications and allergies. Physical Assessment:   Vitals:   06/16/21 1458  BP: 123/88  Pulse: 98  Weight: 214 lb (97.1 kg)  Body mass index is 36.73 kg/m.        Physical Examination:   General appearance: Well appearing, and in no distress  Mental status: Alert, oriented to person, place, and time  Skin: Warm & dry  Cardiovascular: Normal heart rate noted  Respiratory: Normal respiratory effort, no distress  Abdomen: Soft, gravid, nontender  Pelvic: Cervical exam performed         Extremities: Edema: None  Fetal Status:     Movement: Present    Chaperone: n/a    No results found for this or any previous visit (from the past 24 hour(s)).   Assessment & Plan:  1) Low-risk pregnancy G3P0020 at [redacted]w[redacted]d with an Estimated Date of Delivery: 06/19/21   Membranes swept   Meds: No orders of the defined types were placed in this encounter.  Labs/procedures today:   Plan:  Continue routine obstetrical care  Next visit: prefers in person    Reviewed: Term labor symptoms and general obstetric precautions including but not limited to vaginal bleeding, contractions, leaking of fluid and fetal movement were reviewed in detail with the patient.  All questions were answered. Has home bp cuff. Rx faxed to  Check bp weekly, let us know if >140/90.   Follow-up: Return in about 6 days (around 06/22/2021) for NST.  No orders of the defined types were placed in this encounter.   Lazaro Arms, MD 06/16/2021 3:24 PM

## 2021-06-17 ENCOUNTER — Inpatient Hospital Stay (HOSPITAL_COMMUNITY)
Admission: AD | Admit: 2021-06-17 | Discharge: 2021-06-19 | DRG: 806 | Disposition: A | Discharge status: Home / Self Care | Payer: BC Managed Care – PPO | Attending: Obstetrics and Gynecology | Admitting: Obstetrics and Gynecology

## 2021-06-17 ENCOUNTER — Encounter (HOSPITAL_COMMUNITY): Payer: Self-pay | Admitting: Obstetrics and Gynecology

## 2021-06-17 ENCOUNTER — Inpatient Hospital Stay (HOSPITAL_COMMUNITY): Payer: BC Managed Care – PPO | Admitting: Anesthesiology

## 2021-06-17 DIAGNOSIS — O9081 Anemia of the puerperium: Secondary | ICD-10-CM | POA: Diagnosis not present

## 2021-06-17 DIAGNOSIS — Z8616 Personal history of COVID-19: Secondary | ICD-10-CM | POA: Diagnosis not present

## 2021-06-17 DIAGNOSIS — D696 Thrombocytopenia, unspecified: Secondary | ICD-10-CM | POA: Diagnosis not present

## 2021-06-17 DIAGNOSIS — O4202 Full-term premature rupture of membranes, onset of labor within 24 hours of rupture: Secondary | ICD-10-CM

## 2021-06-17 DIAGNOSIS — F418 Other specified anxiety disorders: Secondary | ICD-10-CM

## 2021-06-17 DIAGNOSIS — O26893 Other specified pregnancy related conditions, third trimester: Secondary | ICD-10-CM | POA: Diagnosis present

## 2021-06-17 DIAGNOSIS — Z20822 Contact with and (suspected) exposure to covid-19: Secondary | ICD-10-CM | POA: Diagnosis not present

## 2021-06-17 DIAGNOSIS — O99344 Other mental disorders complicating childbirth: Secondary | ICD-10-CM | POA: Diagnosis not present

## 2021-06-17 DIAGNOSIS — O479 False labor, unspecified: Secondary | ICD-10-CM

## 2021-06-17 DIAGNOSIS — Z3480 Encounter for supervision of other normal pregnancy, unspecified trimester: Secondary | ICD-10-CM

## 2021-06-17 DIAGNOSIS — Z3A39 39 weeks gestation of pregnancy: Secondary | ICD-10-CM

## 2021-06-17 DIAGNOSIS — O9912 Other diseases of the blood and blood-forming organs and certain disorders involving the immune mechanism complicating childbirth: Secondary | ICD-10-CM | POA: Diagnosis present

## 2021-06-17 DIAGNOSIS — O26899 Other specified pregnancy related conditions, unspecified trimester: Secondary | ICD-10-CM

## 2021-06-17 DIAGNOSIS — F4310 Post-traumatic stress disorder, unspecified: Secondary | ICD-10-CM | POA: Diagnosis present

## 2021-06-17 DIAGNOSIS — Z6791 Unspecified blood type, Rh negative: Secondary | ICD-10-CM | POA: Diagnosis not present

## 2021-06-17 DIAGNOSIS — D62 Acute posthemorrhagic anemia: Secondary | ICD-10-CM | POA: Diagnosis not present

## 2021-06-17 DIAGNOSIS — F4325 Adjustment disorder with mixed disturbance of emotions and conduct: Secondary | ICD-10-CM | POA: Diagnosis not present

## 2021-06-17 LAB — CBC
HCT: 40.1 % (ref 36.0–46.0)
HCT: 33.7 % — ABNORMAL LOW (ref 36.0–46.0)
Hemoglobin: 13.6 g/dL (ref 12.0–15.0)
Hemoglobin: 10.8 g/dL — ABNORMAL LOW (ref 12.0–15.0)
MCH: 27.5 pg (ref 26.0–34.0)
MCH: 27.4 pg (ref 26.0–34.0)
MCHC: 33.9 g/dL (ref 30.0–36.0)
MCHC: 32 g/dL (ref 30.0–36.0)
MCV: 81 fL (ref 80.0–100.0)
MCV: 85.5 fL (ref 80.0–100.0)
Platelets: 181 10*3/uL (ref 150–400)
Platelets: 144 10*3/uL — ABNORMAL LOW (ref 150–400)
RBC: 4.95 MIL/uL (ref 3.87–5.11)
RBC: 3.94 MIL/uL (ref 3.87–5.11)
RDW: 13 % (ref 11.5–15.5)
RDW: 13 % (ref 11.5–15.5)
WBC: 16.7 10*3/uL — ABNORMAL HIGH (ref 4.0–10.5)
WBC: 17.2 10*3/uL — ABNORMAL HIGH (ref 4.0–10.5)
nRBC: 0 % (ref 0.0–0.2)
nRBC: 0 % (ref 0.0–0.2)

## 2021-06-17 LAB — DIC (DISSEMINATED INTRAVASCULAR COAGULATION)PANEL
D-Dimer, Quant: 2.82 ug/mL-FEU — ABNORMAL HIGH (ref 0.00–0.50)
Fibrinogen: 528 mg/dL — ABNORMAL HIGH (ref 210–475)
INR: 1.1 (ref 0.8–1.2)
Platelets: 147 10*3/uL — ABNORMAL LOW (ref 150–400)
Prothrombin Time: 14.3 seconds (ref 11.4–15.2)
Smear Review: NONE SEEN
aPTT: 26 seconds (ref 24–36)

## 2021-06-17 LAB — RESP PANEL BY RT-PCR (FLU A&B, COVID) ARPGX2
Influenza A by PCR: NEGATIVE
Influenza B by PCR: NEGATIVE
SARS Coronavirus 2 by RT PCR: NEGATIVE

## 2021-06-17 LAB — POSTPARTUM HEMORRHAGE PROTOCOL (BB NOTIFICATION)

## 2021-06-17 LAB — RPR: RPR Ser Ql: NONREACTIVE

## 2021-06-17 MED ORDER — ONDANSETRON HCL 4 MG/2ML IJ SOLN: 4.0000 mg | INTRAMUSCULAR | Status: DC | PRN

## 2021-06-17 MED ORDER — FENTANYL-BUPIVACAINE-NACL 0.5-0.125-0.9 MG/250ML-% EP SOLN
12.0000 mL/h | EPIDURAL | Status: DC | PRN
  Administered 2021-06-17: 12 mL/h via EPIDURAL
  Filled 2021-06-17: qty 250

## 2021-06-17 MED ORDER — LACTATED RINGERS IV SOLN: INTRAVENOUS | Status: DC

## 2021-06-17 MED ORDER — LIDOCAINE-EPINEPHRINE (PF) 2 %-1:200000 IJ SOLN
INTRAMUSCULAR | Status: DC | PRN
  Administered 2021-06-17: 5 mL via EPIDURAL

## 2021-06-17 MED ORDER — LACTATED RINGERS IV SOLN: 500.0000 mL | Freq: Once | INTRAVENOUS | Status: DC

## 2021-06-17 MED ORDER — PHENYLEPHRINE 40 MCG/ML (10ML) SYRINGE FOR IV PUSH (FOR BLOOD PRESSURE SUPPORT): 80.0000 ug | PREFILLED_SYRINGE | INTRAVENOUS | Status: DC | PRN

## 2021-06-17 MED ORDER — TETANUS-DIPHTH-ACELL PERTUSSIS 5-2.5-18.5 LF-MCG/0.5 IM SUSY: 0.5000 mL | PREFILLED_SYRINGE | Freq: Once | INTRAMUSCULAR | Status: DC

## 2021-06-17 MED ORDER — CEFAZOLIN SODIUM-DEXTROSE 2-4 GM/100ML-% IV SOLN
2.0000 g | Freq: Once | INTRAVENOUS | Status: AC
  Administered 2021-06-17: 2 g via INTRAVENOUS
  Filled 2021-06-17: qty 100

## 2021-06-17 MED ORDER — SODIUM CHLORIDE 0.9% FLUSH
3.0000 mL | Freq: Two times a day (BID) | INTRAVENOUS | Status: DC
  Administered 2021-06-18: 3 mL via INTRAVENOUS

## 2021-06-17 MED ORDER — METHYLERGONOVINE MALEATE 0.2 MG PO TABS
0.2000 mg | ORAL_TABLET | ORAL | Status: AC
  Administered 2021-06-18 (×4): 0.2 mg via ORAL
  Filled 2021-06-17 (×4): qty 1

## 2021-06-17 MED ORDER — BENZOCAINE-MENTHOL 20-0.5 % EX AERO: 1.0000 "application " | INHALATION_SPRAY | CUTANEOUS | Status: DC | PRN

## 2021-06-17 MED ORDER — TRANEXAMIC ACID-NACL 1000-0.7 MG/100ML-% IV SOLN: 1000.0000 mg | INTRAVENOUS | Status: AC

## 2021-06-17 MED ORDER — PHENYLEPHRINE 40 MCG/ML (10ML) SYRINGE FOR IV PUSH (FOR BLOOD PRESSURE SUPPORT)
80.0000 ug | PREFILLED_SYRINGE | INTRAVENOUS | Status: DC | PRN
  Filled 2021-06-17: qty 10

## 2021-06-17 MED ORDER — OXYTOCIN BOLUS FROM INFUSION: 333.0000 mL | Freq: Once | INTRAVENOUS | Status: DC

## 2021-06-17 MED ORDER — IBUPROFEN 600 MG PO TABS
600.0000 mg | ORAL_TABLET | Freq: Four times a day (QID) | ORAL | Status: DC
  Administered 2021-06-18 – 2021-06-19 (×7): 600 mg via ORAL
  Filled 2021-06-17 (×7): qty 1

## 2021-06-17 MED ORDER — SODIUM CHLORIDE 0.9% FLUSH: 3.0000 mL | INTRAVENOUS | Status: DC | PRN

## 2021-06-17 MED ORDER — SENNOSIDES-DOCUSATE SODIUM 8.6-50 MG PO TABS
2.0000 | ORAL_TABLET | ORAL | Status: DC
  Administered 2021-06-18: 2 via ORAL
  Filled 2021-06-17: qty 2

## 2021-06-17 MED ORDER — ONDANSETRON HCL 4 MG/2ML IJ SOLN
4.0000 mg | Freq: Four times a day (QID) | INTRAMUSCULAR | Status: DC | PRN
  Administered 2021-06-17: 4 mg via INTRAVENOUS
  Filled 2021-06-17: qty 2

## 2021-06-17 MED ORDER — ONDANSETRON HCL 4 MG PO TABS: 4.0000 mg | ORAL_TABLET | ORAL | Status: DC | PRN

## 2021-06-17 MED ORDER — TRANEXAMIC ACID-NACL 1000-0.7 MG/100ML-% IV SOLN: 1000.0000 mg | Freq: Once | INTRAVENOUS | Status: DC | PRN

## 2021-06-17 MED ORDER — OXYCODONE-ACETAMINOPHEN 5-325 MG PO TABS: 2.0000 | ORAL_TABLET | ORAL | Status: DC | PRN

## 2021-06-17 MED ORDER — LIDOCAINE HCL (PF) 1 % IJ SOLN
30.0000 mL | INTRAMUSCULAR | Status: DC | PRN
  Filled 2021-06-17: qty 30

## 2021-06-17 MED ORDER — SOD CITRATE-CITRIC ACID 500-334 MG/5ML PO SOLN: 30.0000 mL | ORAL | Status: DC | PRN

## 2021-06-17 MED ORDER — DIPHENHYDRAMINE HCL 25 MG PO CAPS: 25.0000 mg | ORAL_CAPSULE | Freq: Four times a day (QID) | ORAL | Status: DC | PRN

## 2021-06-17 MED ORDER — MEASLES, MUMPS & RUBELLA VAC IJ SOLR: 0.5000 mL | Freq: Once | INTRAMUSCULAR | Status: DC

## 2021-06-17 MED ORDER — SODIUM CHLORIDE 0.9 % IV SOLN: 250.0000 mL | INTRAVENOUS | Status: DC | PRN

## 2021-06-17 MED ORDER — EPHEDRINE 5 MG/ML INJ: 10.0000 mg | INTRAVENOUS | Status: DC | PRN

## 2021-06-17 MED ORDER — LACTATED RINGERS IV BOLUS: 1000.0000 mL | Freq: Once | INTRAVENOUS | Status: DC

## 2021-06-17 MED ORDER — ACETAMINOPHEN 325 MG PO TABS: 650.0000 mg | ORAL_TABLET | ORAL | Status: DC | PRN

## 2021-06-17 MED ORDER — TRANEXAMIC ACID-NACL 1000-0.7 MG/100ML-% IV SOLN
INTRAVENOUS | Status: AC
  Administered 2021-06-17: 1000 mg via INTRAVENOUS
  Filled 2021-06-17: qty 100

## 2021-06-17 MED ORDER — PRENATAL MULTIVITAMIN CH
1.0000 | ORAL_TABLET | Freq: Every day | ORAL | Status: DC
  Administered 2021-06-18 – 2021-06-19 (×2): 1 via ORAL
  Filled 2021-06-17 (×2): qty 1

## 2021-06-17 MED ORDER — WITCH HAZEL-GLYCERIN EX PADS: 1.0000 "application " | MEDICATED_PAD | CUTANEOUS | Status: DC | PRN

## 2021-06-17 MED ORDER — FENTANYL CITRATE (PF) 100 MCG/2ML IJ SOLN: 50.0000 ug | INTRAMUSCULAR | Status: DC | PRN

## 2021-06-17 MED ORDER — METHYLERGONOVINE MALEATE 0.2 MG/ML IJ SOLN
INTRAMUSCULAR | Status: AC
  Administered 2021-06-17: 0.2 mg via INTRAMUSCULAR
  Filled 2021-06-17: qty 1

## 2021-06-17 MED ORDER — DIPHENHYDRAMINE HCL 50 MG/ML IJ SOLN: 12.5000 mg | INTRAMUSCULAR | Status: DC | PRN

## 2021-06-17 MED ORDER — DIBUCAINE (PERIANAL) 1 % EX OINT: 1.0000 "application " | TOPICAL_OINTMENT | CUTANEOUS | Status: DC | PRN

## 2021-06-17 MED ORDER — COCONUT OIL OIL
1.0000 "application " | TOPICAL_OIL | Status: DC | PRN
  Administered 2021-06-18: 1 via TOPICAL

## 2021-06-17 MED ORDER — METHYLERGONOVINE MALEATE 0.2 MG/ML IJ SOLN: 0.2000 mg | Freq: Once | INTRAMUSCULAR | Status: AC

## 2021-06-17 MED ORDER — METHYLERGONOVINE MALEATE 0.2 MG/ML IJ SOLN: 0.2000 mg | INTRAMUSCULAR | Status: AC

## 2021-06-17 MED ORDER — OXYCODONE-ACETAMINOPHEN 5-325 MG PO TABS: 1.0000 | ORAL_TABLET | ORAL | Status: DC | PRN

## 2021-06-17 MED ORDER — LACTATED RINGERS IV SOLN: 500.0000 mL | INTRAVENOUS | Status: DC | PRN

## 2021-06-17 MED ORDER — OXYTOCIN-SODIUM CHLORIDE 30-0.9 UT/500ML-% IV SOLN
2.5000 [IU]/h | INTRAVENOUS | Status: DC
  Filled 2021-06-17: qty 500

## 2021-06-17 MED ORDER — SIMETHICONE 80 MG PO CHEW: 80.0000 mg | CHEWABLE_TABLET | ORAL | Status: DC | PRN

## 2021-06-17 MED ORDER — ZOLPIDEM TARTRATE 5 MG PO TABS: 5.0000 mg | ORAL_TABLET | Freq: Every evening | ORAL | Status: DC | PRN

## 2021-06-17 NOTE — MAU Note (Signed)
Pt c/o storng ctx pain since 2am, 10/10 scale, report small amount of vag bleeding, unsure of leakage of fluid, + fetal movement, denies headache, dizziness or blurry vision, safety maintained.

## 2021-06-17 NOTE — Discharge Summary (Addendum)
Postpartum Discharge Summary  Date of Service updated-yes     Patient Name: Sarah Nichols DOB: 08/29/1995 MRN: [id number]  Date of admission: 06/17/2021 Delivery date:06/17/2021  Delivering provider: Patriciaann Clan  Date of discharge: 06/19/2021  Admitting diagnosis: Indication for care in labor and delivery, antepartum [O75.9] Intrauterine pregnancy: [redacted]w[redacted]d     Secondary diagnosis:  Active Problems:   Rh negative state in antepartum period   Depression with anxiety & PTSD  Additional problems: postpartum hemorrhage    Discharge diagnosis: Term Pregnancy Delivered and PPH                                              Post partum procedures:rhogam and IV venofer Augmentation: N/A Complications: [id number]  Hospital course: Onset of Labor With Vaginal Delivery      25 y.o. yo G3P0020 at [redacted]w[redacted]d was admitted in Latent Labor with SROM prior to arrival on 06/17/2021. Patient had an uncomplicated labor course as follows:  Membrane Rupture Time/Date: 4:45 AM ,06/17/2021   Delivery Method:Vaginal, Spontaneous  Episiotomy: None  Lacerations:  1st degree  Delivery was complicated by PPH due to uterine atony, pt received Methergine and TXA.  No further intervention required.  Patient had an uncomplicated postpartum course.  She is ambulating, tolerating a regular diet, passing flatus, and urinating well. Patient is discharged home in stable condition on 06/19/21.  Newborn Data: Birth date:06/17/2021  Birth time:8:49 PM  Gender:Female  Living status:Living  Apgars:5 ,7  Weight:3033 g   Magnesium Sulfate received: No BMZ received: No Rhophylac:Yes MMR:No T-DaP:Given prenatally Flu: No Transfusion:No  Physical exam  Vitals:   06/18/21 0945 06/18/21 1325 06/18/21 2035 06/19/21 0627  BP: 111/82 101/63 106/61 113/79  Pulse: 75 85 80 72  Resp: $Remo'18 18 17 15  'WujnM$ Temp: 98.1 F (36.7 C) 98.2 F (36.8 C) 98.2 F (36.8 C) 97.8 F (36.6 C)  TempSrc: Oral Oral Oral  Oral  SpO2: 99% 98% 99% 100%   General: alert, cooperative, and no distress Lochia: appropriate Uterine Fundus: firm Incision: N/A DVT Evaluation: No evidence of DVT seen on physical exam. Labs: Lab Results  Component Value Date   WBC 17.0 (H) 06/18/2021   HGB 9.4 (L) 06/18/2021   HCT 28.9 (L) 06/18/2021   MCV 84.5 06/18/2021   PLT 133 (L) 06/18/2021   CMP Latest Ref Rng & Units 04/13/2021  Glucose 70 - 99 mg/dL 111(H)  BUN 6 - 20 mg/dL 6  Creatinine 0.44 - 1.00 mg/dL 0.46  Sodium 135 - 145 mmol/L 131(L)  Potassium 3.5 - 5.1 mmol/L 3.7  Chloride 98 - 111 mmol/L 99  CO2 22 - 32 mmol/L 21(L)  Calcium 8.9 - 10.3 mg/dL 8.9  Total Protein 6.5 - 8.1 g/dL -  Total Bilirubin 0.3 - 1.2 mg/dL -  Alkaline Phos 38 - 126 U/L -  AST 15 - 41 U/L -  ALT 0 - 44 U/L -   Edinburgh Score: Edinburgh Postnatal Depression Scale Screening Tool 06/18/2021  I have been able to laugh and see the funny side of things. 0  I have looked forward with enjoyment to things. 0  I have blamed myself unnecessarily when things went wrong. 2  I have been anxious or worried for no good reason. 3  I have felt scared or panicky for no good reason. 2  Things have been getting on  top of me. 2  I have been so unhappy that I have had difficulty sleeping. 1  I have felt sad or miserable. 1  I have been so unhappy that I have been crying. 1  The thought of harming myself has occurred to me. 0  Edinburgh Postnatal Depression Scale Total 12     After visit meds:  Allergies as of 06/19/2021   No Known Allergies      Medication List     TAKE these medications    acetaminophen 500 MG tablet Commonly known as: TYLENOL Take 1,000 mg by mouth every 6 (six) hours as needed (for pain.).   ibuprofen 600 MG tablet Commonly known as: ADVIL Take 1 tablet (600 mg total) by mouth every 6 (six) hours.   pantoprazole 20 MG tablet Commonly known as: Protonix Take 1 tablet (20 mg total) by mouth daily.   prenatal  vitamin w/FE, FA 27-1 MG Tabs tablet Take 1 tablet by mouth daily at 12 noon.   senna-docusate 8.6-50 MG tablet Commonly known as: Senokot-S Take 2 tablets by mouth daily.   sertraline 100 MG tablet Commonly known as: ZOLOFT Take 1 tablet (100 mg total) by mouth daily.         Discharge home in stable condition Infant Feeding: Breast Infant Disposition:home with mother Discharge instruction: per After Visit Summary and Postpartum booklet. Activity: Advance as tolerated. Pelvic rest for 6 weeks.  Diet: routine diet Future Appointments: Future Appointments  Date Time Provider [place]  06/22/2021  8:50 AM Roma Schanz, CNM [facility]   Follow up Visit:  Follow-up Information     [facility] Follow up.   Specialty: Obstetrics and Gynecology Why: the office will contact you about your post partum visit in 4-6 weeks Contact information: [place]                Message sent to [facility] by Dr Higinio Plan:   Please schedule this patient for a In person postpartum visit in 6 weeks with the following provider: Any provider. Additional Postpartum F/U:Postpartum Depression checkup  Low risk pregnancy complicated by:  Anx/dep  Delivery mode:  Vaginal, Spontaneous  Anticipated Birth Control:  [facility]UD at [facility]    06/19/2021 Jorje Guild, NP

## 2021-06-17 NOTE — Anesthesia Procedure Notes (Signed)
Epidural Patient location during procedure: OB Start time: 06/17/2021 8:00 AM End time: 06/17/2021 8:10 AM  Staffing Anesthesiologist: Elmer Picker, MD Performed: anesthesiologist   Preanesthetic Checklist Completed: patient identified, IV checked, risks and benefits discussed, monitors and equipment checked, pre-op evaluation and timeout performed  Epidural Patient position: sitting Prep: DuraPrep and site prepped and draped Patient monitoring: continuous pulse ox, blood pressure, heart rate and cardiac monitor Approach: midline Location: L3-L4 Injection technique: LOR air  Needle:  Needle type: Tuohy  Needle gauge: 17 G Needle length: 9 cm Needle insertion depth: 6 cm Catheter type: closed end flexible Catheter size: 19 Gauge Catheter at skin depth: 12 cm Test dose: negative  Assessment Sensory level: T8 Events: blood not aspirated, injection not painful, no injection resistance, no paresthesia and negative IV test  Additional Notes Patient identified. Risks/Benefits/Options discussed with patient including but not limited to bleeding, infection, nerve damage, paralysis, failed block, incomplete pain control, headache, blood pressure changes, nausea, vomiting, reactions to medication both or allergic, itching and postpartum back pain. Confirmed with bedside nurse the patient's most recent platelet count. Confirmed with patient that they are not currently taking any anticoagulation, have any bleeding history or any family history of bleeding disorders. Patient expressed understanding and wished to proceed. All questions were answered. Sterile technique was used throughout the entire procedure. Please see nursing notes for vital signs. Test dose was given through epidural catheter and negative prior to continuing to dose epidural or start infusion. Warning signs of high block given to the patient including shortness of breath, tingling/numbness in hands, complete motor block,  or any concerning symptoms with instructions to call for help. Patient was given instructions on fall risk and not to get out of bed. All questions and concerns addressed with instructions to call with any issues or inadequate analgesia.  Reason for block:procedure for pain

## 2021-06-17 NOTE — H&P (Signed)
OBSTETRIC ADMISSION HISTORY AND PHYSICAL  Sarah Nichols is a 25 y.o. female G3P0020 with IUP at [redacted]w[redacted]d by LMP/US presenting for ROM around 0500 and ctx around 0200. She reports +FMs, No LOF, no VB, no blurry vision, headaches or peripheral edema, and RUQ pain.  She plans on breast feeding. She request outpt IUD for birth control. She received her prenatal care at [facility]   Dating: By LMP/8 week [place] --->  Estimated Date of Delivery: 06/19/21  Sono:     Prenatal History/Complications: none  Past Medical History: Past Medical History:  Diagnosis Date   Ovarian cyst    PTSD (post-traumatic stress disorder)     Past Surgical History: Past Surgical History:  Procedure Laterality Date   DILATION AND EVACUATION N/A 03/21/2019   Procedure: DILATATION AND EVACUATION;  Surgeon: Linzie Collin, MD;  Location: [facility] ORS;  Service: Gynecology;  Laterality: N/A;   KNEE ARTHROSCOPY WITH ANTERIOR CRUCIATE LIGAMENT (ACL) REPAIR WITH HAMSTRING GRAFT     no history      Obstetrical History: OB History     Gravida  3   Para      Term      Preterm      AB  2   Living         SAB  2   IAB      Ectopic      Multiple      Live Births              Social History Social History   Socioeconomic History   Marital status: Married    Spouse name: Not on file   Number of children: Not on file   Years of education: Not on file   Highest education level: Not on file  Occupational History   Not on file  Tobacco Use   Smoking status: Never   Smokeless tobacco: Never  Vaping Use   Vaping Use: Never used  Substance and Sexual Activity   Alcohol use: Not Currently   Drug use: Not Currently    Types: Marijuana   Sexual activity: Yes    Birth control/protection: None  Other Topics Concern   Not on file  Social History Narrative   Not on file   Social Determinants of Health   Financial Resource Strain: Low Risk    Difficulty of Paying Living Expenses:  Not very hard  Food Insecurity: No Food Insecurity   Worried About Running Out of Food in the Last Year: Never true   Ran Out of Food in the Last Year: Never true  Transportation Needs: No Transportation Needs   Lack of Transportation (Medical): No   Lack of Transportation (Non-Medical): No  Physical Activity: Insufficiently Active   Days of Exercise per Week: 2 days   Minutes of Exercise per Session: 30 min  Stress: Stress Concern Present   Feeling of Stress : To some extent  Social Connections: Socially Isolated   Frequency of Communication with Friends and Family: Once a week   Frequency of Social Gatherings with Friends and Family: Once a week   Attends Religious Services: Never   Database administrator or Organizations: No   Attends Engineer, structural: Never   Marital Status: Married    Family History: Family History  Problem Relation Age of Onset   Cervical cancer Mother    Depression Mother    Anxiety disorder Mother    Hypertension Father    Gallstones Paternal Grandmother  Cancer Paternal Grandmother        skin    Allergies: No Known Allergies  Medications Prior to Admission  Medication Sig Dispense Refill Last Dose   acetaminophen (TYLENOL) 500 MG tablet Take 1,000 mg by mouth every 6 (six) hours as needed (for pain.).   06/16/2021 at 1900   pantoprazole (PROTONIX) 20 MG tablet Take 1 tablet (20 mg total) by mouth daily. 30 tablet 1 06/16/2021 at 1900   prenatal vitamin w/FE, FA (PRENATAL 1 + 1) 27-1 MG TABS tablet Take 1 tablet by mouth daily at 12 noon.   06/16/2021 at 1900   sertraline (ZOLOFT) 100 MG tablet Take 1 tablet (100 mg total) by mouth daily. 90 tablet 3 06/16/2021 at 1900     Review of Systems   All systems reviewed and negative except as stated in HPI  Blood pressure 118/73, pulse 75, temperature 98.1 F (36.7 C), temperature source Oral, resp. rate 18, last menstrual period 09/12/2020, SpO2 100 %, unknown if currently  breastfeeding. General appearance: alert, cooperative, and mild distress Lungs: clear to auscultation bilaterally Heart: regular rate and rhythm Abdomen: soft, non-tender; bowel sounds normal Extremities: Homans sign is negative, no sign of DVT DTR's 2+ Presentation: cephalic Fetal monitoringBaseline: 150 bpm, Variability: Good {> 6 bpm), Accelerations: Reactive, and Decelerations: Absent Uterine activity2-3 minutes Dilation: 3.5 Effacement (%): 80 Station: -2 Exam by:: Dacres RN   Prenatal labs: ABO, Rh: --/--/O NEG (11/18 2706) Antibody: PENDING (11/18 2376) Rubella: 7.24 (05/11 1448) RPR: Non Reactive (08/30 0859)  HBsAg: Negative (05/11 1448)  HIV: Non Reactive (08/30 0859)  GBS: Negative/-- (10/25 0000)     FAMILY TREE  RESULTS  Language English Pap 06/10/20 neg  Initiated care at 12wk GC/CT Initial: -/-           36wks:  -/-  Dating by LMP c/w 8wk U/S    Support person Hunt Oris NT/IT: neg    Panorama: low-risk female  BP cuff Has BP cuff Carrier Screen declined    [place]/Hgb Elec neg  Rhogam 04/26/21     TDaP vaccine 03/29/21 Blood Type O/Negative/-- (05/11 1448)  Flu vaccine 04/26/21  Antibody Positive, See Final Results (05/11 1448)  Covid vaccine 5/21 HBsAg Negative (05/11 1448)    RPR Non Reactive (05/11 1448)  Anatomy US Normal female 'Piper' Rubella  7.24 (05/11 1448)  Feeding Plan breast HIV Non Reactive (05/11 1448)  Contraception IUD in office Hep C neg  Circumcision n/a    Pediatrician List given A1C/GTT Early:      26-28wks: normal  Prenatal Classes discussed      GBS   neg  [ ]  PCN allergy  BTL Consent     VBAC Consent n/a PHQ9 & GAD7  [ ] New OB  [ ] 28wks   [ ] 36wks  Waterbirth [x] Class [x]  36wkCNM visit/consent      Prenatal Transfer Tool  Maternal Diabetes: No Genetic Screening: Normal Maternal Ultrasounds/Referrals: Normal Fetal Ultrasounds or other Referrals:  None Maternal Substance Abuse:  No Significant Maternal Medications:   None Significant Maternal Lab Results: Group B Strep negative  Results for orders placed or performed during the hospital encounter of 06/17/21 (from the past 24 hour(s))  CBC   Collection Time: 06/17/21  6:35 AM  Result Value Ref Range   WBC 16.7 (H) 4.0 - 10.5 K/uL   RBC 4.95 3.87 - 5.11 MIL/uL   Hemoglobin 13.6 12.0 - 15.0 g/dL   HCT  - %   MCV  81.0 80.0 - 100.0 fL   MCH 27.5 26.0 - 34.0 pg   MCHC 33.9 30.0 - 36.0 g/dL   RDW 55.7 32.2 - 02.5 %   Platelets 181 150 - 400 K/uL   nRBC 0.0 0.0 - 0.2 %  Type and screen [facility]   Collection Time: 06/17/21  6:35 AM  Result Value Ref Range   ABO/RH(D) O NEG    Antibody Screen PENDING    Sample Expiration      06/20/2021,2359 Performed at [facility], [address] [place], [place] [address]   Resp Panel by RT-PCR (Flu A&B, Covid) Nasopharyngeal Swab   Collection Time: 06/17/21  6:39 AM   Specimen: Nasopharyngeal Swab; Nasopharyngeal(NP) swabs in vial transport medium  Result Value Ref Range   SARS Coronavirus 2 by RT PCR NEGATIVE NEGATIVE   Influenza A by PCR NEGATIVE NEGATIVE   Influenza B by PCR NEGATIVE NEGATIVE    Patient Active Problem List   Diagnosis Date Noted   Indication for care in labor and delivery, antepartum 06/17/2021   [redacted] weeks gestation of pregnancy 06/14/2021   COVID 04/13/2021   Encounter for supervision of normal pregnancy, antepartum 12/08/2020   Rh negative state in antepartum period 12/08/2020   Depression with anxiety & PTSD 12/08/2020    Assessment/Plan:  Sarah Nichols is a 25 y.o. G3P0020 at [redacted]w[redacted]d here forROM/early labor  #Labor:expectant mgt #Pain: epidural #FWB: Cat 1  Jacklyn Shell, CNM  06/17/2021, 7:51 AM

## 2021-06-17 NOTE — MAU Provider Note (Addendum)
Chief Complaint:  Contractions   Event Date/Time   First Provider Initiated Contact with Patient 06/17/21 0602    Sarah Nichols  HPI:  is a 25 y.o. G3P0020 at 38w5dwho presents to maternity admissions reporting painful contractions and leaking of fluid.  Contractions started at 2 but leaking started about 5am. She reports good fetal movement, denies h/a, dizziness, n/v, diarrhea, constipation or fever/chills.   Vaginal Discharge The patient's primary symptoms include pelvic pain, vaginal bleeding (blood tinged fluid) and vaginal discharge. The patient's pertinent negatives include no genital itching, genital lesions or genital odor. This is a new problem. The current episode started today. The problem occurs intermittently. The problem has been unchanged. The pain is moderate. She is pregnant. Associated symptoms include abdominal pain. Pertinent negatives include no chills, fever, nausea or vomiting. The vaginal discharge was bloody, watery and clear. She has not been passing clots. She has not been passing tissue. Nothing aggravates the symptoms. She has tried nothing for the symptoms.  .  RN Note: Pt c/o storng ctx pain since 2am, 10/10 scale, report small amount of vag bleeding, unsure of leakage of fluid, + fetal movement, denies headache, dizziness or blurry vision, safety maintained.  Past Medical History: Past Medical History:  Diagnosis Date   Ovarian cyst    PTSD (post-traumatic stress disorder)     Past obstetric history: OB History  Gravida Para Term Preterm AB Living  3       2    SAB IAB Ectopic Multiple Live Births  2            # Outcome Date GA Lbr Len/2nd Weight Sex Delivery Anes PTL Lv  3 Current           2 SAB 03/21/19 [redacted]w[redacted]d         1 SAB 11/2018            Past Surgical History: Past Surgical History:  Procedure Laterality Date   DILATION AND EVACUATION N/A 03/21/2019   Procedure: DILATATION AND EVACUATION;  Surgeon: Linzie Collin, MD;   Location: [facility] ORS;  Service: Gynecology;  Laterality: N/A;   KNEE ARTHROSCOPY WITH ANTERIOR CRUCIATE LIGAMENT (ACL) REPAIR WITH HAMSTRING GRAFT     no history      Family History: Family History  Problem Relation Age of Onset   Cervical cancer Mother    Depression Mother    Anxiety disorder Mother    Hypertension Father    Gallstones Paternal Grandmother    Cancer Paternal Grandmother        skin    Social History: Social History   Tobacco Use   Smoking status: Never   Smokeless tobacco: Never  Vaping Use   Vaping Use: Never used  Substance Use Topics   Alcohol use: Not Currently   Drug use: Not Currently    Types: Marijuana    Allergies: No Known Allergies  Meds:  Medications Prior to Admission  Medication Sig Dispense Refill Last Dose   acetaminophen (TYLENOL) 500 MG tablet Take 1,000 mg by mouth every 6 (six) hours as needed (for pain.).   06/16/2021 at 1900   pantoprazole (PROTONIX) 20 MG tablet Take 1 tablet (20 mg total) by mouth daily. 30 tablet 1 06/16/2021 at 1900   prenatal vitamin w/FE, FA (PRENATAL 1 + 1) 27-1 MG TABS tablet Take 1 tablet by mouth daily at 12 noon.   06/16/2021 at 1900   sertraline (ZOLOFT) 100 MG tablet Take 1 tablet (100 mg total)  by mouth daily. 90 tablet 3 06/16/2021 at 1900    I have reviewed patient's Past Medical Hx, Surgical Hx, Family Hx, Social Hx, medications and allergies.   ROS:  Review of Systems  Constitutional:  Negative for chills and fever.  Gastrointestinal:  Positive for abdominal pain. Negative for nausea and vomiting.  Genitourinary:  Positive for pelvic pain and vaginal discharge.  Other systems negative  Physical Exam  Patient Vitals for the past 24 hrs:  SpO2  06/17/21 0555 98 %   Constitutional: Well-developed, well-nourished female in no acute distress.  Cardiovascular: normal rate and rhythm Respiratory: normal effort GI: Abd soft, non-tender, gravid appropriate for gestational age.   No rebound or  guarding. MS: Extremities nontender, no edema, normal ROM Neurologic: Alert and oriented x 4.  GU: Neg CVAT.  PELVIC EXAM: Moderate pooling of blood tinged clear discharge, vaginal walls and external genitalia normal    + ferning  Dilation: 3.5 Effacement (%): 80 Station: -2 Presentation: Vertex Exam by:: Dacres RN  FHT:  Baseline 135 , moderate variability, accelerations present, no decelerations Contractions: q 2-3 mins Irregular     Labs: Routine labs ordered O/Negative/-- (05/11 1448)  Imaging:    MAU Course/MDM: SSE done and does reveal ruptured membranes NST reviewed, reactive, category I  Treatments in MAU included EFM, SSE.    Assessment: Single IUP at [redacted]w[redacted]d Active Labor PROM at term  Plan: Admit to Labor and Delivery Routine orders Labor team to follow Declines waterbirth, wants epidural  Wynelle Bourgeois CNM, MSN Certified Nurse-Midwife 06/17/2021 6:02 AM

## 2021-06-17 NOTE — Lactation Note (Signed)
This note was copied from a baby's chart. Lactation Consultation Note  Patient Name: Sarah Nichols [id number] Date: 06/17/2021 Reason for consult: L&D Initial assessment;1st time breastfeeding;Term Age:26 hours LC entered the room, infant was cuing to breastfeed. LC observed mom has semi-flat nipples, mom did breast stimulation to evert nipple shaft out more. Mom latched infant on her left breast using the cross cradle hold, infant sustained latch, swallows observed and infant was still breastfeeding after 10 minutes when LC left the room. Mom knows to call RN/LC on MBU if she has breastfeeding questions, concerns or need further assistance with latching infant at the breast. Mom could benefit from hand pump to pre-pump breast prior to latching infant at the breast, on MBU.  Maternal Data    Feeding Mother's Current Feeding Choice: Breast Milk  LATCH Score Latch: Grasps breast easily, tongue down, lips flanged, rhythmical sucking.  Audible Swallowing: Spontaneous and intermittent  Type of Nipple: Flat  Comfort (Breast/Nipple): Soft / non-tender  Hold (Positioning): Assistance needed to correctly position infant at breast and maintain latch.  LATCH Score: 8   Lactation Tools Discussed/Used    Interventions Interventions: Skin to skin;Breast compression;Adjust position;Support pillows;Position options;Expressed milk;Education  Discharge Pump: Personal WIC Program: No  Consult Status Consult Status: Follow-up from L&D    Danelle Earthly 06/17/2021, 10:54 PM

## 2021-06-17 NOTE — Progress Notes (Signed)
Sarah Nichols is a 25 y.o. G3P0020 at [redacted]w[redacted]d   Subjective: Verbalizes desire to continue expectant management  Objective: BP 116/77   Pulse 70   Temp 98.1 F (36.7 C) (Oral)   Resp 18   LMP 09/12/2020   SpO2 100%  No intake/output data recorded. No intake/output data recorded.  FHT:  FHR: 130 bpm, variability: moderate,  accelerations:  Present,  decelerations:  Absent UC:   irregular,  SVE:   Dilation: 3.5 Effacement (%): 80 Station: -2 Exam by:: Dacres RN  Labs: Lab Results  Component Value Date   WBC 16.7 (H) 06/17/2021   HGB 13.6 06/17/2021   HCT 40.1 06/17/2021   MCV 81.0 06/17/2021   PLT 181 06/17/2021    Assessment / Plan: --25 y.o. G3P0020 at [redacted]w[redacted]d  --Cat I tracing --GBS NEG --Admitted for SROM at 0500 and early labor --Tracing and labor status reviewed with Sarah Rieger, RN --Patient requests time to rest --Will plan to visit bedside and discuss checking cervix around 1100 (6 hour from admission exam)  Sarah Nichols, CNM 06/17/2021, 10:12 AM

## 2021-06-17 NOTE — Anesthesia Preprocedure Evaluation (Signed)
Anesthesia Evaluation  Patient identified by MRN, date of birth, ID band Patient awake    Reviewed: Allergy & Precautions, NPO status , Patient's Chart, lab work & pertinent test results  Airway Mallampati: II  TM Distance: >3 FB Neck ROM: Full    Dental no notable dental hx.    Pulmonary neg pulmonary ROS,    Pulmonary exam normal breath sounds clear to auscultation       Cardiovascular negative cardio ROS Normal cardiovascular exam Rhythm:Regular Rate:Normal     Neuro/Psych negative neurological ROS  negative psych ROS   GI/Hepatic Neg liver ROS, GERD  Medicated and Controlled,  Endo/Other  negative endocrine ROS  Renal/GU negative Renal ROS  negative genitourinary   Musculoskeletal negative musculoskeletal ROS (+)   Abdominal   Peds  Hematology negative hematology ROS (+)   Anesthesia Other Findings   Reproductive/Obstetrics (+) Pregnancy                             Anesthesia Physical Anesthesia Plan  ASA: 2  Anesthesia Plan: Epidural   Post-op Pain Management:    Induction:   PONV Risk Score and Plan: Treatment may vary due to age or medical condition  Airway Management Planned: Natural Airway  Additional Equipment:   Intra-op Plan:   Post-operative Plan:   Informed Consent: I have reviewed the patients History and Physical, chart, labs and discussed the procedure including the risks, benefits and alternatives for the proposed anesthesia with the patient or authorized representative who has indicated his/her understanding and acceptance.       Plan Discussed with: Anesthesiologist  Anesthesia Plan Comments: (Patient identified. Risks, benefits, options discussed with patient including but not limited to bleeding, infection, nerve damage, paralysis, failed block, incomplete pain control, headache, blood pressure changes, nausea, vomiting, reactions to medication,  itching, and post partum back pain. Confirmed with bedside nurse the patient's most recent platelet count. Confirmed with the patient that they are not taking any anticoagulation, have any bleeding history or any family history of bleeding disorders. Patient expressed understanding and wishes to proceed. All questions were answered. )        Anesthesia Quick Evaluation

## 2021-06-17 NOTE — Progress Notes (Signed)
Labor Progress Note Sarah Nichols is a 25 y.o. G3P0020 at [redacted]w[redacted]d presented for SOL.  S: Resting comfortably in bed journaling  O:  BP 121/76   Pulse 72   Temp 98.4 F (36.9 C) (Oral)   Resp 18   LMP 09/12/2020   SpO2 100%  EFM: baseline 135/moderate variability/accelerations present, no decels  CVE: Dilation: 7 Effacement (%): 100 Station: 0 Presentation: Vertex Exam by:: Auriel RN   A&P: 25 y.o. [id number] [redacted]w[redacted]d admitted for SOL. #Labor: Progressing well. Will continue expectant management for now. #Pain: Epidural in place #FWB: Category I #GBS negative    Reeves Forth, MD 3:32 PM

## 2021-06-18 LAB — CBC WITH DIFFERENTIAL/PLATELET
Abs Immature Granulocytes: 0.07 10*3/uL (ref 0.00–0.07)
Basophils Absolute: 0 10*3/uL (ref 0.0–0.1)
Basophils Relative: 0 %
Eosinophils Absolute: 0.1 10*3/uL (ref 0.0–0.5)
Eosinophils Relative: 0 %
HCT: 28.9 % — ABNORMAL LOW (ref 36.0–46.0)
Hemoglobin: 9.4 g/dL — ABNORMAL LOW (ref 12.0–15.0)
Immature Granulocytes: 0 %
Lymphocytes Relative: 13 %
Lymphs Abs: 2.2 10*3/uL (ref 0.7–4.0)
MCH: 27.5 pg (ref 26.0–34.0)
MCHC: 32.5 g/dL (ref 30.0–36.0)
MCV: 84.5 fL (ref 80.0–100.0)
Monocytes Absolute: 1.3 10*3/uL — ABNORMAL HIGH (ref 0.1–1.0)
Monocytes Relative: 8 %
Neutro Abs: 13.3 10*3/uL — ABNORMAL HIGH (ref 1.7–7.7)
Neutrophils Relative %: 79 %
Platelets: 133 10*3/uL — ABNORMAL LOW (ref 150–400)
RBC: 3.42 MIL/uL — ABNORMAL LOW (ref 3.87–5.11)
RDW: 13.2 % (ref 11.5–15.5)
WBC: 17 10*3/uL — ABNORMAL HIGH (ref 4.0–10.5)
nRBC: 0 % (ref 0.0–0.2)

## 2021-06-18 MED ORDER — RHO D IMMUNE GLOBULIN 1500 UNIT/2ML IJ SOSY
300.0000 ug | PREFILLED_SYRINGE | Freq: Once | INTRAMUSCULAR | Status: AC
  Administered 2021-06-18: 300 ug via INTRAVENOUS
  Filled 2021-06-18: qty 2

## 2021-06-18 MED ORDER — SODIUM CHLORIDE 0.9 % IV SOLN
500.0000 mg | Freq: Once | INTRAVENOUS | Status: AC
  Administered 2021-06-18: 500 mg via INTRAVENOUS
  Filled 2021-06-18: qty 25

## 2021-06-18 NOTE — Lactation Note (Signed)
This note was copied from a baby's chart. Lactation Consultation Note  Patient Name: Sarah Nichols [id number] Date: 06/18/2021 Reason for consult: Follow-up assessment;Nipple pain/trauma;Term;Primapara Age:26 hours  Mom states nipples are sore.  Scabbing noted on left nipple and bruising and redness on right nipple.  Mom can easily express colostrum.    Upon suck assessment, baby was chomping on gloved finger.  Tongue does extend to lips but when sucking, bottom gum is felt only.  Suck training done.   Baby has tightness in jaws and tongue humps at the back of tongue.  Lingual frenulum felt under tongue.  Mom felt severe pain when infant latched for 30 seconds.  Swallows were heard but mom felt the pain was too great.    LC encouraged hand expression, and finger or spoon feeding back to baby.  Mom held baby STS and LC will return to check back with family.    Maternal Data Has patient been taught Hand Expression?: Yes Does the patient have breastfeeding experience prior to this delivery?: No  Feeding Mother's Current Feeding Choice: Breast Milk  LATCH Score Latch: Grasps breast easily, tongue down, lips flanged, rhythmical sucking.  Audible Swallowing: None  Type of Nipple: Everted at rest and after stimulation  Comfort (Breast/Nipple): Engorged, cracked, bleeding, large blisters, severe discomfort (severe discomfort with latch)  Hold (Positioning): Assistance needed to correctly position infant at breast and maintain latch.  LATCH Score: 5   Lactation Tools Discussed/Used    Interventions Interventions: Breast feeding basics reviewed;Hand express;Breast massage;Adjust position;Support pillows;Coconut oil;Education  Discharge    Consult Status Consult Status: Follow-up Date: 06/19/21 Follow-up type: In-patient    Maryruth Hancock [facility] 06/18/2021, 7:56 PM

## 2021-06-18 NOTE — Lactation Note (Signed)
This note was copied from a baby's chart. Lactation Consultation Note  Patient Name: Sarah Nichols [id number] Date: 06/18/2021 Reason for consult: Primapara;Follow-up assessment;Difficult latch;Nipple pain/trauma;RN request Age:26 hours  Infant cueing on mom's chest.  LC assisted with latch and mom felt extreme pain with latch.   Mom hand expresses milk easily.   Baby does not open her mouth wide.  When sucking gloved finger, clamping is felt.  NS attempted but infant clamped down and once she did suck, mom had too much pain to let baby continue.  Spoon feeding was not working due to infant not extending her tongue.   Finger fed 3 ml with curved tip syringe to demonstrate to parents.  Parents decided to bottle feed EBM or DBM to infant.  Mom was set up DEBP.  RN to review.  LC observed first few minutes of pumping and mom used a 24 on the right and 21 on the left.    Plan: Pump then follow with hand expression and feed back any EBM to baby.  Supplement with DBM if mom is not able to collect at this time.    RN is aware of plan.      Maternal Data Has patient been taught Hand Expression?: Yes  Feeding Mother's Current Feeding Choice: Breast Milk and Donor Milk  LATCH Score Latch: Grasps breast easily, tongue down, lips flanged, rhythmical sucking.  Audible Swallowing: None  Type of Nipple: Everted at rest and after stimulation (shorter shaft)  Comfort (Breast/Nipple): Engorged, cracked, bleeding, large blisters, severe discomfort  Hold (Positioning): Assistance needed to correctly position infant at breast and maintain latch.  LATCH Score: 5   Lactation Tools Discussed/Used Tools: Pump Breast pump type: Double-Electric Breast Pump Pump Education: Setup, frequency, and cleaning;Milk Storage Reason for Pumping: latch too painful/ nipple damage/ stimulate supply and collect EBM  Interventions Interventions: Breast feeding basics reviewed;Assisted with  latch;Hand express;Breast massage;DEBP;Education  Discharge Pump: DEBP  Consult Status Consult Status: Follow-up Date: 06/19/21 Follow-up type: In-patient    Maryruth Hancock [facility] 06/18/2021, 11:33 PM

## 2021-06-18 NOTE — Progress Notes (Addendum)
POSTPARTUM PROGRESS NOTE  Post Partum Day 1  Subjective:  Sarah Nichols is a 25 y.o. [id number] s/p VD at [redacted]w[redacted]d. She also had a PPH with EBL around . She feels tired but denies any SOB, lightheadedness or dizziness.  She reports she is doing well. No acute events overnight. She denies any problems with ambulating, voiding or po intake. Denies nausea or vomiting.  Pain is well controlled.  Lochia is mild.  Objective: Blood pressure 121/79, pulse 64, temperature 98.1 F (36.7 C), temperature source Oral, resp. rate 18, last menstrual period 09/12/2020, SpO2 100 %, unknown if currently breastfeeding.  Physical Exam:  General: alert, cooperative and no distress Chest: no respiratory distress Heart:regular rate, distal pulses intact Uterine Fundus: firm, appropriately tender DVT Evaluation: No calf swelling or tenderness Extremities: minimal edema Skin: warm, dry  Recent Labs    06/17/21 2134 06/18/21 0622  HGB 10.8* 9.4*  HCT 33.7* 28.9*    Assessment/Plan: Sarah Nichols is a 25 y.o. P5T6144 s/p VD at [redacted]w[redacted]d   PPD#1 - Doing well  Routine postpartum care  #Acute blood loss anemia: PPH with delivery, bleeding is now mild. Hgb 13>9.4. Some fatigue, but otherwise doing well. Plan for IV venofer this morning after discussing risks and benefits with patient.   #Thrombocytopenia: Plts 181>133. Associated with ABLA. Will monitor.   #RH Negative: Infant O-pos. RH workup initiated, will receive RhoGAM.   Contraception: outpatient IUD  Feeding: Breastfeeding   Dispo: Plan for discharge tomorrow.   LOS: 1 day   Sarah Penna, DO  OB Fellow  06/18/2021, 7:55 AM

## 2021-06-18 NOTE — Anesthesia Postprocedure Evaluation (Signed)
Anesthesia Post Note  Patient: Sarah Nichols  Procedure(s) Performed: AN AD HOC LABOR EPIDURAL     Patient location during evaluation: Mother Baby Anesthesia Type: Epidural Level of consciousness: awake and alert Pain management: pain level controlled Vital Signs Assessment: post-procedure vital signs reviewed and stable Respiratory status: spontaneous breathing, nonlabored ventilation and respiratory function stable Cardiovascular status: stable Postop Assessment: no headache, no backache and epidural receding Anesthetic complications: no   No notable events documented.  Last Vitals:  Vitals:   06/18/21 0115 06/18/21 0531  BP: 106/74 121/79  Pulse: 93 64  Resp: 16 18  Temp: 37 C 36.7 C  SpO2: 98% 100%    Last Pain:  Vitals:   06/18/21 0738  TempSrc:   PainSc: Asleep   Pain Goal: Patients Stated Pain Goal: 0 (06/17/21 0556)                 [facility] Records

## 2021-06-18 NOTE — Lactation Note (Signed)
This note was copied from a baby's chart. Lactation Consultation Note  Patient Name: Sarah Nichols [id number] Date: 06/18/2021 Reason for consult: Initial assessment;Primapara;1st time breastfeeding;Term Age:26 hours   LC Note:  Attempted to visit with family, however, all family members asleep at this time.  Will return later today for a consult.   Maternal Data    Feeding Mother's Current Feeding Choice: Breast Milk  LATCH Score                    Lactation Tools Discussed/Used    Interventions    Discharge    Consult Status Consult Status: Follow-up Date: 06/18/21 Follow-up type: In-patient    Shabnam Ladd R Briant Angelillo 06/18/2021, 7:28 AM

## 2021-06-19 LAB — TYPE AND SCREEN
ABO/RH(D): O NEG
Antibody Screen: POSITIVE
Unit division: 0
Unit division: 0

## 2021-06-19 LAB — BPAM RBC
Blood Product Expiration Date: 202211222359
Blood Product Expiration Date: 202211222359
Unit Type and Rh: 9500
Unit Type and Rh: 9500

## 2021-06-19 LAB — RH IG WORKUP (INCLUDES ABO/RH)
Fetal Screen: NEGATIVE
Gestational Age(Wks): 39
Unit division: 0

## 2021-06-19 MED ORDER — SENNOSIDES-DOCUSATE SODIUM 8.6-50 MG PO TABS: 2.0000 | ORAL_TABLET | ORAL | 0 refills | Status: DC

## 2021-06-19 MED ORDER — IBUPROFEN 600 MG PO TABS: 600.0000 mg | ORAL_TABLET | Freq: Four times a day (QID) | ORAL | 0 refills | Status: DC

## 2021-06-19 NOTE — Progress Notes (Signed)
CSW received consult due to score 12 on Edinburgh Depression Screen.    When CSW arrived, MOB was bonding with infant as evidence by engaging in skin to skin, and FOB was observing their interactions; everyone appeared happy and comfortable. CSW explained CSW's role and MOB gave CSW verbal permission to complete assessment while FOB was present. FOB appeared to be a support for MOB and he engaged with CSW.  FOB asked questions to best support MOB during the postpartum period.   CSW reviewed MOB's Edinburgh results. CSW  provided education regarding the baby blues period vs. perinatal mood disorders, discussed treatment and gave resources for mental health follow up if concerns arise.  CSW recommends self-evaluation during the postpartum time period using the New Mom Checklist from Postpartum Progress and encouraged MOB to contact a medical professional if symptoms are noted at any time; MOB agreed.  CSW assessed for safety and MOB denied SI, HI, and DV. MOB also shared that she has a good support team and she feels comfortable seeking help if needed.   Per family, they have all essential items for infant and they feel prepared for MOB and infant's discharge.   CSW identifies no further need for intervention and no barriers to discharge at this time.   Blaine Hamper, MSW, LCSW Clinical Social Work [phone]

## 2021-06-19 NOTE — Progress Notes (Signed)
MOB was referred for history of depression/anxiety. * Referral screened out by Clinical Social Worker because none of the following criteria appear to apply: ~ History of anxiety/depression during this pregnancy, or of post-partum depression following prior delivery. ~ Diagnosis of anxiety and/or depression within last 3 years OR * MOB's symptoms currently being treated with medication and/or therapy. MOB has an active Rx for Zoloft. No concerns noted in OB records.   Please contact the Clinical Social Worker if needs arise, by [facility] request, or if MOB scores greater than 9/yes to question 10 on Edinburgh Postpartum Depression Screen.   CSW received consult for hx of marijuana use.  Referral was screened out due to the following: ~MOB had no documented substance use after initial prenatal visit/+UPT. ~MOB had no positive drug screens after initial prenatal visit/+UPT. ~Baby's UDS is negative.  Please consult CSW if current concerns arise or by MOB's request.  CSW will monitor CDS results and make report to Child Protective Services if warranted.  Blaine Hamper, MSW, LCSW Clinical Social Work [phone]

## 2021-06-20 DIAGNOSIS — F4325 Adjustment disorder with mixed disturbance of emotions and conduct: Secondary | ICD-10-CM | POA: Diagnosis not present

## 2021-06-22 ENCOUNTER — Other Ambulatory Visit: Payer: BC Managed Care – PPO | Admitting: Women's Health

## 2021-06-24 DIAGNOSIS — F4325 Adjustment disorder with mixed disturbance of emotions and conduct: Secondary | ICD-10-CM | POA: Diagnosis not present

## 2021-06-27 DIAGNOSIS — F4325 Adjustment disorder with mixed disturbance of emotions and conduct: Secondary | ICD-10-CM | POA: Diagnosis not present

## 2021-06-29 ENCOUNTER — Telehealth (HOSPITAL_COMMUNITY): Payer: Self-pay | Admitting: *Deleted

## 2021-06-29 DIAGNOSIS — F4325 Adjustment disorder with mixed disturbance of emotions and conduct: Secondary | ICD-10-CM | POA: Diagnosis not present

## 2021-06-29 NOTE — Telephone Encounter (Signed)
Attempted hospital discharge follow-up call. Left message for patient to return RN call. Deforest Hoyles, RN, 06/29/21, [phone]

## 2021-06-30 ENCOUNTER — Other Ambulatory Visit: Payer: Self-pay

## 2021-06-30 ENCOUNTER — Ambulatory Visit (INDEPENDENT_AMBULATORY_CARE_PROVIDER_SITE_OTHER): Payer: BC Managed Care – PPO | Admitting: Adult Health

## 2021-06-30 ENCOUNTER — Encounter: Payer: Self-pay | Admitting: Adult Health

## 2021-06-30 VITALS — BP 110/74 | HR 91 | Ht 64.0 in | Wt 199.0 lb

## 2021-06-30 DIAGNOSIS — F53 Postpartum depression: Secondary | ICD-10-CM | POA: Diagnosis not present

## 2021-06-30 DIAGNOSIS — F32A Depression, unspecified: Secondary | ICD-10-CM

## 2021-06-30 DIAGNOSIS — F419 Anxiety disorder, unspecified: Secondary | ICD-10-CM | POA: Diagnosis not present

## 2021-06-30 MED ORDER — SERTRALINE HCL 100 MG PO TABS: ORAL_TABLET | ORAL | 3 refills | Status: DC

## 2021-06-30 NOTE — Progress Notes (Signed)
  Subjective:     Patient ID: Sarah Nichols, female   DOB: 12-03-1995, 25 y.o.   MRN: [id number]  HPI Sarah Nichols is a 25 year old white femalemale,married, [id number], in for postpartum depression check, she is on Zoloft 100 mg and has been throughout pregnancy. She had vaginal delivery 06/17/21 baby girl [place].   Lab Results  Component Value Date   DIAGPAP  06/10/2020    - Negative for intraepithelial lesion or malignancy (NILM)   PCP is [facility] in [place].  Review of Systems +depression, no SI, feel like meds needs increasing Reviewed past medical,surgical, social and family history. Reviewed medications and allergies.     Objective:   Physical Exam BP 110/74 (BP Location: Left Arm, Patient Position: Sitting, Cuff Size: Normal)   Pulse 91   Ht 5\' 4"  (1.626 m)   Wt 199 lb (90.3 kg)   Breastfeeding Yes   BMI 34.16 kg/m     Skin warm and dry. Neck: mid line trachea, normal thyroid, good ROM, no lymphadenopathy noted. Lungs: clear to ausculation bilaterally. Cardiovascular: regular rate and rhythm.      EPDS is 12, no SI or HI   Upstream - 06/30/21 1209       Pregnancy Intention Screening   Does the patient want to become pregnant in the next year? No    Does the patient's partner want to become pregnant in the next year? No    Would the patient like to discuss contraceptive options today? No      Contraception Wrap Up   Current Method Abstinence    End Method Abstinence    Contraception Counseling Provided No             Assessment:      1. Anxiety and depression,history of  2. Postpartum depression Will increase Zoloft to 150 mg daily,discussed with Dr 1210 Rest when baby rests  Take time for self Ask dad to help  Meds ordered this encounter  Medications   sertraline (ZOLOFT) 100 MG tablet    Sig: Take 1 1/2 tablets daily    Dispense:  45 tablet    Refill:  3    Order Specific Question:   Supervising Provider    Answer:   Despina Hidden [[id number]]        Plan:     Follow up 07/20/21 with 07/22/21 CNM for postpartum visit

## 2021-07-06 DIAGNOSIS — F4325 Adjustment disorder with mixed disturbance of emotions and conduct: Secondary | ICD-10-CM | POA: Diagnosis not present

## 2021-07-08 DIAGNOSIS — F4325 Adjustment disorder with mixed disturbance of emotions and conduct: Secondary | ICD-10-CM | POA: Diagnosis not present

## 2021-07-12 DIAGNOSIS — F4325 Adjustment disorder with mixed disturbance of emotions and conduct: Secondary | ICD-10-CM | POA: Diagnosis not present

## 2021-07-15 DIAGNOSIS — F4325 Adjustment disorder with mixed disturbance of emotions and conduct: Secondary | ICD-10-CM | POA: Diagnosis not present

## 2021-07-20 ENCOUNTER — Ambulatory Visit: Payer: BC Managed Care – PPO | Admitting: Women's Health

## 2021-07-20 DIAGNOSIS — F4325 Adjustment disorder with mixed disturbance of emotions and conduct: Secondary | ICD-10-CM | POA: Diagnosis not present

## 2021-07-26 ENCOUNTER — Other Ambulatory Visit: Payer: Self-pay | Admitting: *Deleted

## 2021-07-26 DIAGNOSIS — F4325 Adjustment disorder with mixed disturbance of emotions and conduct: Secondary | ICD-10-CM | POA: Diagnosis not present

## 2021-07-26 MED ORDER — PANTOPRAZOLE SODIUM 20 MG PO TBEC: 20.0000 mg | DELAYED_RELEASE_TABLET | Freq: Every day | ORAL | 1 refills | Status: DC

## 2021-07-28 ENCOUNTER — Other Ambulatory Visit: Payer: Self-pay

## 2021-07-28 ENCOUNTER — Encounter: Payer: Self-pay | Admitting: Obstetrics & Gynecology

## 2021-07-28 ENCOUNTER — Ambulatory Visit (INDEPENDENT_AMBULATORY_CARE_PROVIDER_SITE_OTHER): Payer: BC Managed Care – PPO | Admitting: Obstetrics & Gynecology

## 2021-07-28 DIAGNOSIS — F4325 Adjustment disorder with mixed disturbance of emotions and conduct: Secondary | ICD-10-CM | POA: Diagnosis not present

## 2021-07-28 DIAGNOSIS — Z30430 Encounter for insertion of intrauterine contraceptive device: Secondary | ICD-10-CM

## 2021-07-28 DIAGNOSIS — Z3202 Encounter for pregnancy test, result negative: Secondary | ICD-10-CM | POA: Diagnosis not present

## 2021-07-28 LAB — POCT URINE PREGNANCY: Preg Test, Ur: NEGATIVE

## 2021-07-28 MED ORDER — LEVONORGESTREL 20 MCG/DAY IU IUD
1.0000 | INTRAUTERINE_SYSTEM | Freq: Once | INTRAUTERINE | Status: AC
  Administered 2021-07-28: 10:00:00 1 via INTRAUTERINE

## 2021-07-28 NOTE — Progress Notes (Signed)
POSTPARTUM VISIT Patient name: Sarah Nichols MRN [id number]  Date of birth: Aug 28, 1995 Chief Complaint:   Postpartum Care and Contraception  History of Present Illness:   Sarah Nichols is a 25 y.o. G40P1021 female being seen today for a postpartum visit. She is 6 weeks postpartum following a spontaneous vaginal delivery at 73w5dgestational weeks on 06/17/21.  IOL: No, presented in latent labor.  At her 2wk postpartum check, zoloft increased to 1567mdaily.  Being seen by therapist.  Overall doing well. Denies SI/HI   Pregnancy uncomplicated.  Last pap smear: 05/2020   Postpartum course has been complicated by PPH . Uterine atony noted during delivery- received, Methergine and TXA.  Postpartum received IV Venofer due to anemia.  Bleeding no bleeding. Bowel function is normal. Bladder function is normal. Urinary incontinence? No, fecal incontinence? No Patient is not sexually active. Last sexual activity: during pregnancy.   Desired contraception: IUD. Patient does not know want a pregnancy in the future.   Desired family size is unsure children.   Upstream - 07/28/21 0915       Pregnancy Intention Screening   Does the patient want to become pregnant in the next year? No    Does the patient's partner want to become pregnant in the next year? No    Would the patient like to discuss contraceptive options today? Yes      Contraception Wrap Up   Current Method Abstinence    End Method IUD or IUS    Contraception Counseling Provided Yes            The pregnancy intention screening data noted above was reviewed. Potential methods of contraception were discussed. The patient elected to proceed with IUD insertion  Edinburgh Postpartum Depression Screening: Positive 11  Edinburgh Postnatal Depression Scale - 07/28/21 0905       Edinburgh Postnatal Depression Scale:  In the Past 7 Days   I have been able to laugh and see the funny side of things. 1    I have  looked forward with enjoyment to things. 1    I have blamed myself unnecessarily when things went wrong. 2    I have been anxious or worried for no good reason. 2    I have felt scared or panicky for no good reason. 2    Things have been getting on top of me. 2    I have been so unhappy that I have had difficulty sleeping. 0    I have felt sad or miserable. 1    I have been so unhappy that I have been crying. 1    The thought of harming myself has occurred to me. 0    Edinburgh Postnatal Depression Scale Total 12             Baby's course has been uncomplicated. Baby is feeding by breast: milk supply adequate. Infant has a pediatrician/family doctor? Yes.  Childcare strategy if returning to work/school: going back to work on 1/16- working from home part time  Pt has material needs met for her and baby: Yes.    Review of Systems:   Pertinent items are noted in HPI Denies Abnormal vaginal discharge w/ itching/odor/irritation, headaches, visual changes, shortness of breath, chest pain, abdominal pain, severe nausea/vomiting, or problems with urination or bowel movements. Pertinent History Reviewed:  Reviewed past medical,surgical, obstetrical and family history.  Reviewed problem list, medications and allergies. OB History  Gravida Para Term Preterm AB Living  3 1  _0 SAB IAB Ectopic Multiple Live Births  2     0 1    # Outcome Date GA Lbr Len/2nd Weight Sex Delivery Anes PTL Lv  3 Term 06/17/21 19w5d13:40 / 02:09 6 lb 11 oz (3.033 kg) F Vag-Spont EPI  LIV     Birth Comments: WDL  2 SAB 03/21/19 128w1d       1 SAB 11/2018           Physical Assessment:   Vitals:   07/28/21 0904  BP: 121/83  Pulse: 80  Weight: 198 lb 3.2 oz (89.9 kg)  Height: 5' 4" (1.626 m)  Body mass index is 34.02 kg/m.       Physical Examination:   General appearance: alert, well appearing, and in no distress  Mental status: normal mood, behavior, speech, dress, motor activity, and thought  processes  Skin: warm & dry   Cardiovascular: normal heart rate noted   Respiratory: normal respiratory effort, no distress   Breasts: deferred, no complaints   Abdomen: soft, non-tender   Pelvic: normal external genitalia, vulva, vagina, cervix, uterus and adnexa  Rectal: no hemorrhoids  Extremities: no edema  Chaperone: Angel Neas    IUD INSERTION  A sterile speculum was placed in the vagina.  The cervix was visualized, prepped using Betadine, and grasped with a single tooth tenaculum. The uterus was found to be anteroflexed and it sounded to 7 cm.  Mirena  IUD placed per manufacturer's recommendations. The strings were trimmed to approximately 3 cm. The patient tolerated the procedure well.   Informal transvaginal sonogram was performed and the proper placement of the IUD was verified.  Chaperone: AnMarcelino Scot       Results for orders placed or performed in visit on 07/28/21 (from the past 24 hour(s))  POCT urine pregnancy   Collection Time: 07/28/21  9:16 AM  Result Value Ref Range   Preg Test, Ur Negative Negative    Assessment & Plan:  1) Postpartum exam 2) 6 wks s/p spontaneous vaginal delivery 3) breast feeding -Notes some nipple sensitivity/discomfort -Verbal order for Newman's nipple cream  4) Depression screening 5) Contraception management: Mirena placed The patient was given post procedure instructions, including signs and symptoms of infection and to check for the strings after each menses or each month, and refraining from intercourse or anything in the vagina for 3 days. She was given a care card with date IUD placed, and date IUD to be removed. She is scheduled for a f/u appointment in 6-8 weeks.  Essential components of care per ACOG recommendations:  1.  Mood and well being:  If positive depression screen, discussed and plan developed.  If using tobacco we discussed reduction/cessation and risk of relapse If current substance abuse, we discussed and  referral to local resources was offered.   2. Infant care and feeding:  If breastfeeding, discussed returning to work, pumping, breastfeeding-associated pain, guidance regarding return to fertility while lactating if not using another method Recommended that all caregivers be immunized for flu, pertussis and other preventable communicable diseases  3. Sexuality, contraception and birth spacing Provided guidance regarding sexuality, management of dyspareunia, and resumption of intercourse Discussed avoiding interpregnancy interval <15m71m and recommended birth spacing of 18 months  4. Sleep and fatigue Discussed coping options for fatigue and sleep disruption Encouraged family/partner/community support of 4 hrs of uninterrupted sleep to help with mood and fatigue  5. Physical recovery  If  pt had a C/S, assessed incisional pain and providing guidance on normal vs prolonged recovery If pt had a laceration, perineal healing and pain reviewed.  If urinary or fecal incontinence, discussed management and referred to PT or uro/gyn if indicated  Patient is safe to resume physical activity. Discussed attainment of healthy weight.  6. Health maintenance Mammogram at 26yo or earlier if indicated Pap smear up to date, reviewed guidelines  Meds: -continue zoloft 142m daily -Newman's nipple cream prn   Follow-up: Return in about 7 weeks (around 09/15/2021) for IUD check (6-8wks).   Orders Placed This Encounter  Procedures   POCT urine pregnancy   JJanyth Pupa DO Attending [place] [facility] [place]

## 2021-09-08 ENCOUNTER — Ambulatory Visit: Payer: Self-pay | Admitting: Obstetrics & Gynecology

## 2021-09-14 ENCOUNTER — Telehealth: Payer: Self-pay | Admitting: Adult Health

## 2021-09-14 MED ORDER — SERTRALINE HCL 100 MG PO TABS: ORAL_TABLET | ORAL | 3 refills | Status: DC

## 2021-09-14 NOTE — Telephone Encounter (Signed)
Returned pt's call for clarification, two identifiers used. Pt stated that Dr Nelda Marseille wanted her to finish her 100 mg taking 1 and 1/2 tablets daily for a total dosage of 150 mg daily, then she would send in a prescription for the 150 mg to take daily. Pt's pharmacy had never received this order.

## 2021-09-14 NOTE — Telephone Encounter (Signed)
Pt aware that Rx sent in for zoloft 100 mg take 1.5 tabs daily to = 150 mg #45 with 3 RF

## 2021-09-14 NOTE — Addendum Note (Signed)
Addended by: Cyril Mourning A on: 09/14/2021 05:26 PM   Modules accepted: Orders

## 2021-09-14 NOTE — Telephone Encounter (Signed)
Patient called stating that she was suppose to have her Zoloft called in the last time she was here for 150 mg. Patient states that she called her pharmacy and they stated that they have never received anything from our office. Please contact pt

## 2021-12-14 ENCOUNTER — Ambulatory Visit
Admission: EM | Admit: 2021-12-14 | Discharge: 2021-12-14 | Disposition: A | Discharge status: Home / Self Care | Payer: BC Managed Care – PPO | Attending: Nurse Practitioner | Admitting: Nurse Practitioner

## 2021-12-14 DIAGNOSIS — L03011 Cellulitis of right finger: Secondary | ICD-10-CM | POA: Diagnosis not present

## 2021-12-14 MED ORDER — DOXYCYCLINE HYCLATE 100 MG PO CAPS: 100.0000 mg | ORAL_CAPSULE | Freq: Two times a day (BID) | ORAL | 0 refills | Status: DC

## 2021-12-14 NOTE — Discharge Instructions (Addendum)
Take medication as prescribed. ?Keep the area clean and dry. ?Continue warm compresses to the right thumb. ?Continue to monitor the site for signs of infection to include worsening redness, swelling, foul-smelling drainage, fever, chills, or malaise. ?Follow-up if symptoms do not improve. ?

## 2021-12-14 NOTE — ED Triage Notes (Signed)
Pt reports pain, swelling and redness is the right thumb x 3 days. Tylenol gives no relief.  ?

## 2021-12-14 NOTE — ED Provider Notes (Signed)
?[facility] ? ? ? ?CSN: [id number] ?Arrival date & time: 12/14/21  1217 ? ? ?  ? ?History   ?Chief Complaint ?Chief Complaint  ?Patient presents with  ? Nail Problem  ? ? ?HPI ?Sarah Nichols is a 26 y.o. female.  ? ?The patient is a 26 year old female who presents with pain and swelling in the right thumb.  Symptoms have been present for the past 3 days.  Patient states over the past 24 hours, she has had worsening swelling and pain in the right thumb.  States she has been using warm compresses to the thumb without relief.  She denies fever, chills, abdominal pain, nausea, vomiting, or diarrhea. States that she does bite her nails ? ?The history is provided by the patient.  ? ?Past Medical History:  ?Diagnosis Date  ? Ovarian cyst   ? PTSD (post-traumatic stress disorder)   ? ? ?Patient Active Problem List  ? Diagnosis Date Noted  ? 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 Anxiety and depression 06/30/2021  ? COVID 04/13/2021  ? 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 ? ?Past Surgical History:  ?Procedure Laterality Date  ? DILATION AND EVACUATION N/A 03/21/2019  ? Procedure: DILATATION AND EVACUATION;  Surgeon: Harlin Heys, MD;  Location: [facility] ORS;  Service: Gynecology;  Laterality: N/A;  ? KNEE ARTHROSCOPY WITH ANTERIOR CRUCIATE LIGAMENT (ACL) REPAIR WITH HAMSTRING GRAFT    ? no history    ? ? ?OB History   ? ? Gravida  ?3  ? Para  ?1  ? Term  ?1  ? Preterm  ?   ? AB  ?2  ? Living  ?1  ?  ? ? SAB  ?2  ? IAB  ?   ? Ectopic  ?   ? Multiple  ?0  ? Live Births  ?1  ?   ?  ?  ? ? ? ?Home Medications   ? ?Prior to Admission medications   ?Medication Sig Start Date End Date Taking? Authorizing Provider  ?doxycycline (VIBRAMYCIN) 100 MG capsule Take 1 capsule (100 mg total) by mouth 2 (two) times daily. 12/14/21  Yes Simone Rodenbeck-Warren, Alda Lea, NP  ?acetaminophen (TYLENOL) 500 MG tablet Take 1,000 mg by mouth every 6 (six) hours as needed (for pain.).    [provider]  ?pantoprazole  (PROTONIX) 20 MG tablet Take 1 tablet (20 mg total) by mouth daily. 07/26/21   Roma Schanz, CNM  ?prenatal vitamin w/FE, FA (PRENATAL 1 + 1) 27-1 MG TABS tablet Take 1 tablet by mouth daily at 12 noon.    [provider]  ?sertraline (ZOLOFT) 100 MG tablet Take 1 1/2 tablets daily 09/14/21   Estill Dooms, NP  ? ? ?Family History ?Family History  ?Problem Relation Age of Onset  ? Gallstones Paternal Grandmother   ? Cancer Paternal Grandmother   ?     skin  ? Hypertension Father   ? Cervical cancer Mother   ? Depression Mother   ? Anxiety disorder Mother   ? ? ?Social History ?Social History  ? ?Tobacco Use  ? Smoking status: Never  ? Smokeless tobacco: Never  ?Vaping Use  ? Vaping Use: Never used  ?Substance Use Topics  ? Alcohol use: Not Currently  ? Drug use: Not Currently  ?  Types: Marijuana  ? ? ? ?Allergies   ?Patient has no known allergies. ? ? ?Review of Systems ?Review of Systems  ?Constitutional: Negative.   ?Musculoskeletal:   ?  Swelling around the nailbed of the right thumb  ?Skin: Negative.   ?Psychiatric/Behavioral: Negative.    ? ? ?Physical Exam ?Triage Vital Signs ?ED Triage Vitals  ?Enc Vitals Group  ?   BP 12/14/21 1247 124/83  ?   Pulse Rate 12/14/21 1247 76  ?   Resp 12/14/21 1247 16  ?   Temp 12/14/21 1247 97.9 ?F (36.6 ?C)  ?   Temp Source 12/14/21 1247 Oral  ?   SpO2 12/14/21 1247 97 %  ?   Weight --   ?   Height --   ?   Head Circumference --   ?   Peak Flow --   ?   Pain Score 12/14/21 1246 6  ?   Pain Loc --   ?   Pain Edu? --   ?   Excl. in [place]? --   ? ?No data found. ? ?Updated Vital Signs ?BP 124/83 (BP Location: Right Arm)   Pulse 76   Temp 97.9 ?F (36.6 ?C) (Oral)   Resp 16   LMP 11/10/2021   SpO2 97%   Breastfeeding No  ? ?Visual Acuity ?Right Eye Distance:   ?Left Eye Distance:   ?Bilateral Distance:   ? ?Right Eye Near:   ?Left Eye Near:    ?Bilateral Near:    ? ?Physical Exam ?Vitals and nursing note reviewed.  ?HENT:  ?   Head: Normocephalic.   ?Pulmonary:  ?   Effort: Pulmonary effort is normal.  ?Skin: ?   General: Skin is warm and dry.  ?   Comments: Paronychia of right thumb.  ?Neurological:  ?   General: No focal deficit present.  ?   Mental Status: She is oriented to person, place, and time.  ? ? ? ?UC Treatments / Results  ?Labs ?(all labs ordered are listed, but only abnormal results are displayed) ?Labs Reviewed - No data to display ? ?EKG ? ? ?Radiology ?No results found. ? ?Procedures ?Incision and Drainage ? ?Date/Time: 12/14/2021 1:10 PM ?Performed by: Tish Men, NP ?Authorized by: Tish Men, NP  ? ?Consent:  ?  Consent obtained:  Verbal ?  Consent given by:  Patient ?  Risks discussed:  Bleeding and pain ?  Alternatives discussed:  No treatment ?Universal protocol:  ?  Patient identity confirmed:  Verbally with patient ?Location:  ?  Indications for incision and drainage: Paronychia. ?  Location: Right thumb. ?Pre-procedure details:  ?  Skin preparation:  Povidone-iodine ?Sedation:  ?  Sedation type:  None ?Anesthesia:  ?  Anesthesia method: Cold spray. ?Procedure type:  ?  Complexity:  Simple ?Procedure details:  ?  Drainage:  Serosanguinous ?  Drainage amount:  Moderate ?Post-procedure details:  ?  Procedure completion:  Tolerated ?Comments:  ?   #18-gauge needle used to infiltrate the paronychia of the right thumb.  Moderate pus appearing, and serosanguineous drainage returned.  Dressing with antibiotic ointment applied.  Patient tolerated procedure well. (including critical care time) ? ?Medications Ordered in UC ?Medications - No data to display ? ?Initial Impression / Assessment and Plan / UC Course  ?I have reviewed the triage vital signs and the nursing notes. ? ?Pertinent labs & imaging results that were available during my care of the patient were reviewed by me and considered in my medical decision making (see chart for details). ? ?The patient is a 26 year old female who presents with a paronychia of  the right thumb.  Symptoms started approximately 3 days ago  and worsened over the past 24 hours.  On exam, the patient has moderate swelling around the nailbed.  A #18-gauge needle was used to infiltrate the paronychia with moderate return of purulent and serosanguineous drainage from the right thumb.  Patient tolerated the procedure well.  Patient was started on doxycycline at this time.  Patient advised to continue supportive care to include warm soaks, ibuprofen or Tylenol for pain or fever.  Also discussed with the patient strict return precautions such as fever, chills, worsening swelling, pain, streaking, redness, or malaise.  Patient verbalizes understanding, all questions were answered. ?Final Clinical Impressions(s) / UC Diagnoses  ? ?Final diagnoses:  ?Paronychia of thumb, right  ? ? ? ?Discharge Instructions   ? ?  ?Take medication as prescribed. ?Keep the area clean and dry. ?Continue warm compresses to the right thumb. ?Continue to monitor the site for signs of infection to include worsening redness, swelling, foul-smelling drainage, fever, chills, or malaise. ?Follow-up if symptoms do not improve. ? ? ? ? ?ED Prescriptions   ? ? Medication Sig Dispense Auth. Provider  ? doxycycline (VIBRAMYCIN) 100 MG capsule Take 1 capsule (100 mg total) by mouth 2 (two) times daily. 20 capsule Leigha Olberding-Warren, Alda Lea, NP  ? ?  ? ?PDMP not reviewed this encounter. ?  ?Tish Men, NP ?12/14/21 1411 ? ?

## 2021-12-16 DIAGNOSIS — F4325 Adjustment disorder with mixed disturbance of emotions and conduct: Secondary | ICD-10-CM | POA: Diagnosis not present

## 2021-12-19 DIAGNOSIS — F4325 Adjustment disorder with mixed disturbance of emotions and conduct: Secondary | ICD-10-CM | POA: Diagnosis not present

## 2021-12-20 DIAGNOSIS — F4325 Adjustment disorder with mixed disturbance of emotions and conduct: Secondary | ICD-10-CM | POA: Diagnosis not present

## 2021-12-21 DIAGNOSIS — F4325 Adjustment disorder with mixed disturbance of emotions and conduct: Secondary | ICD-10-CM | POA: Diagnosis not present

## 2021-12-23 DIAGNOSIS — F4325 Adjustment disorder with mixed disturbance of emotions and conduct: Secondary | ICD-10-CM | POA: Diagnosis not present

## 2021-12-28 DIAGNOSIS — F4325 Adjustment disorder with mixed disturbance of emotions and conduct: Secondary | ICD-10-CM | POA: Diagnosis not present

## 2021-12-30 DIAGNOSIS — F4325 Adjustment disorder with mixed disturbance of emotions and conduct: Secondary | ICD-10-CM | POA: Diagnosis not present

## 2022-01-04 DIAGNOSIS — F4325 Adjustment disorder with mixed disturbance of emotions and conduct: Secondary | ICD-10-CM | POA: Diagnosis not present

## 2022-01-18 DIAGNOSIS — F4325 Adjustment disorder with mixed disturbance of emotions and conduct: Secondary | ICD-10-CM | POA: Diagnosis not present

## 2022-01-25 DIAGNOSIS — F4325 Adjustment disorder with mixed disturbance of emotions and conduct: Secondary | ICD-10-CM | POA: Diagnosis not present

## 2022-02-08 DIAGNOSIS — F4325 Adjustment disorder with mixed disturbance of emotions and conduct: Secondary | ICD-10-CM | POA: Diagnosis not present

## 2022-02-09 ENCOUNTER — Other Ambulatory Visit: Payer: Self-pay | Admitting: Adult Health

## 2022-02-13 DIAGNOSIS — F4325 Adjustment disorder with mixed disturbance of emotions and conduct: Secondary | ICD-10-CM | POA: Diagnosis not present

## 2022-02-17 DIAGNOSIS — F4325 Adjustment disorder with mixed disturbance of emotions and conduct: Secondary | ICD-10-CM | POA: Diagnosis not present

## 2022-02-22 DIAGNOSIS — F4325 Adjustment disorder with mixed disturbance of emotions and conduct: Secondary | ICD-10-CM | POA: Diagnosis not present

## 2022-02-28 DIAGNOSIS — F4325 Adjustment disorder with mixed disturbance of emotions and conduct: Secondary | ICD-10-CM | POA: Diagnosis not present

## 2022-03-06 DIAGNOSIS — F4325 Adjustment disorder with mixed disturbance of emotions and conduct: Secondary | ICD-10-CM | POA: Diagnosis not present

## 2022-03-08 DIAGNOSIS — F4325 Adjustment disorder with mixed disturbance of emotions and conduct: Secondary | ICD-10-CM | POA: Diagnosis not present

## 2022-03-16 DIAGNOSIS — F4325 Adjustment disorder with mixed disturbance of emotions and conduct: Secondary | ICD-10-CM | POA: Diagnosis not present

## 2022-03-17 DIAGNOSIS — F4325 Adjustment disorder with mixed disturbance of emotions and conduct: Secondary | ICD-10-CM | POA: Diagnosis not present

## 2022-03-20 DIAGNOSIS — F4325 Adjustment disorder with mixed disturbance of emotions and conduct: Secondary | ICD-10-CM | POA: Diagnosis not present

## 2022-03-22 DIAGNOSIS — F4325 Adjustment disorder with mixed disturbance of emotions and conduct: Secondary | ICD-10-CM | POA: Diagnosis not present

## 2022-03-27 DIAGNOSIS — F4325 Adjustment disorder with mixed disturbance of emotions and conduct: Secondary | ICD-10-CM | POA: Diagnosis not present

## 2022-03-29 DIAGNOSIS — F4325 Adjustment disorder with mixed disturbance of emotions and conduct: Secondary | ICD-10-CM | POA: Diagnosis not present

## 2022-03-31 DIAGNOSIS — F332 Major depressive disorder, recurrent severe without psychotic features: Secondary | ICD-10-CM | POA: Diagnosis not present

## 2022-03-31 DIAGNOSIS — F4310 Post-traumatic stress disorder, unspecified: Secondary | ICD-10-CM | POA: Diagnosis not present

## 2022-03-31 DIAGNOSIS — F411 Generalized anxiety disorder: Secondary | ICD-10-CM | POA: Diagnosis not present

## 2022-04-03 DIAGNOSIS — F4325 Adjustment disorder with mixed disturbance of emotions and conduct: Secondary | ICD-10-CM | POA: Diagnosis not present

## 2022-04-06 DIAGNOSIS — F4325 Adjustment disorder with mixed disturbance of emotions and conduct: Secondary | ICD-10-CM | POA: Diagnosis not present

## 2022-04-10 DIAGNOSIS — F4325 Adjustment disorder with mixed disturbance of emotions and conduct: Secondary | ICD-10-CM | POA: Diagnosis not present

## 2022-05-01 DIAGNOSIS — F4310 Post-traumatic stress disorder, unspecified: Secondary | ICD-10-CM | POA: Diagnosis not present

## 2022-05-01 DIAGNOSIS — F332 Major depressive disorder, recurrent severe without psychotic features: Secondary | ICD-10-CM | POA: Diagnosis not present

## 2022-05-01 DIAGNOSIS — F411 Generalized anxiety disorder: Secondary | ICD-10-CM | POA: Diagnosis not present

## 2022-05-31 DIAGNOSIS — F4325 Adjustment disorder with mixed disturbance of emotions and conduct: Secondary | ICD-10-CM | POA: Diagnosis not present

## 2022-06-02 DIAGNOSIS — F4325 Adjustment disorder with mixed disturbance of emotions and conduct: Secondary | ICD-10-CM | POA: Diagnosis not present

## 2022-06-06 DIAGNOSIS — F4325 Adjustment disorder with mixed disturbance of emotions and conduct: Secondary | ICD-10-CM | POA: Diagnosis not present

## 2022-06-08 DIAGNOSIS — F4325 Adjustment disorder with mixed disturbance of emotions and conduct: Secondary | ICD-10-CM | POA: Diagnosis not present

## 2022-06-13 DIAGNOSIS — F4325 Adjustment disorder with mixed disturbance of emotions and conduct: Secondary | ICD-10-CM | POA: Diagnosis not present

## 2022-06-14 DIAGNOSIS — F4325 Adjustment disorder with mixed disturbance of emotions and conduct: Secondary | ICD-10-CM | POA: Diagnosis not present

## 2022-06-15 DIAGNOSIS — F4325 Adjustment disorder with mixed disturbance of emotions and conduct: Secondary | ICD-10-CM | POA: Diagnosis not present

## 2022-06-20 DIAGNOSIS — F332 Major depressive disorder, recurrent severe without psychotic features: Secondary | ICD-10-CM | POA: Diagnosis not present

## 2022-06-20 DIAGNOSIS — F4310 Post-traumatic stress disorder, unspecified: Secondary | ICD-10-CM | POA: Diagnosis not present

## 2022-06-20 DIAGNOSIS — F411 Generalized anxiety disorder: Secondary | ICD-10-CM | POA: Diagnosis not present

## 2022-06-20 DIAGNOSIS — F4325 Adjustment disorder with mixed disturbance of emotions and conduct: Secondary | ICD-10-CM | POA: Diagnosis not present

## 2022-06-22 DIAGNOSIS — F4325 Adjustment disorder with mixed disturbance of emotions and conduct: Secondary | ICD-10-CM | POA: Diagnosis not present

## 2022-06-26 DIAGNOSIS — F4325 Adjustment disorder with mixed disturbance of emotions and conduct: Secondary | ICD-10-CM | POA: Diagnosis not present

## 2023-05-23 DIAGNOSIS — F4312 Post-traumatic stress disorder, chronic: Secondary | ICD-10-CM | POA: Diagnosis not present

## 2023-05-23 DIAGNOSIS — F331 Major depressive disorder, recurrent, moderate: Secondary | ICD-10-CM | POA: Diagnosis not present

## 2023-05-23 DIAGNOSIS — F411 Generalized anxiety disorder: Secondary | ICD-10-CM | POA: Diagnosis not present

## 2023-06-06 DIAGNOSIS — F331 Major depressive disorder, recurrent, moderate: Secondary | ICD-10-CM | POA: Diagnosis not present

## 2023-06-06 DIAGNOSIS — F411 Generalized anxiety disorder: Secondary | ICD-10-CM | POA: Diagnosis not present

## 2023-06-06 DIAGNOSIS — F4312 Post-traumatic stress disorder, chronic: Secondary | ICD-10-CM | POA: Diagnosis not present

## 2023-08-03 DIAGNOSIS — F331 Major depressive disorder, recurrent, moderate: Secondary | ICD-10-CM | POA: Diagnosis not present

## 2023-08-03 DIAGNOSIS — F4312 Post-traumatic stress disorder, chronic: Secondary | ICD-10-CM | POA: Diagnosis not present

## 2023-08-03 DIAGNOSIS — F411 Generalized anxiety disorder: Secondary | ICD-10-CM | POA: Diagnosis not present

## 2023-09-24 DIAGNOSIS — F331 Major depressive disorder, recurrent, moderate: Secondary | ICD-10-CM | POA: Diagnosis not present

## 2023-09-24 DIAGNOSIS — F411 Generalized anxiety disorder: Secondary | ICD-10-CM | POA: Diagnosis not present

## 2023-09-24 DIAGNOSIS — F4312 Post-traumatic stress disorder, chronic: Secondary | ICD-10-CM | POA: Diagnosis not present

## 2023-10-19 ENCOUNTER — Other Ambulatory Visit (HOSPITAL_COMMUNITY)
Admission: RE | Admit: 2023-10-19 | Discharge: 2023-10-19 | Disposition: A | Discharge status: Home / Self Care | Source: Ambulatory Visit | Attending: Obstetrics & Gynecology | Admitting: Obstetrics & Gynecology

## 2023-10-19 ENCOUNTER — Ambulatory Visit: Admitting: *Deleted

## 2023-10-19 DIAGNOSIS — B9689 Other specified bacterial agents as the cause of diseases classified elsewhere: Secondary | ICD-10-CM | POA: Diagnosis not present

## 2023-10-19 DIAGNOSIS — R109 Unspecified abdominal pain: Secondary | ICD-10-CM | POA: Diagnosis not present

## 2023-10-19 DIAGNOSIS — Z113 Encounter for screening for infections with a predominantly sexual mode of transmission: Secondary | ICD-10-CM | POA: Diagnosis not present

## 2023-10-19 DIAGNOSIS — N760 Acute vaginitis: Secondary | ICD-10-CM | POA: Insufficient documentation

## 2023-10-19 DIAGNOSIS — F331 Major depressive disorder, recurrent, moderate: Secondary | ICD-10-CM | POA: Diagnosis not present

## 2023-10-19 DIAGNOSIS — F411 Generalized anxiety disorder: Secondary | ICD-10-CM | POA: Diagnosis not present

## 2023-10-19 DIAGNOSIS — F4312 Post-traumatic stress disorder, chronic: Secondary | ICD-10-CM | POA: Diagnosis not present

## 2023-10-19 NOTE — Progress Notes (Signed)
   NURSE VISIT- VAGINITIS/STD  SUBJECTIVE:  Sarah Nichols is a 28 y.o. [id number] GYN patientfemale here for a vaginal swab for vaginitis screening, STD screen.  She reports the following symptoms:  abdominal cramping .  Has IUD and partner can feel strings.  Recently found out partner cheated.  Denies abnormal vaginal bleeding, significant pelvic pain, fever, or UTI symptoms.    OBJECTIVE:  There were no vitals taken for this visit.  Appears well, in no apparent distress  ASSESSMENT: Vaginal swab for STD screen/vaginitis  PLAN: Self-collected vaginal probe for Gonorrhea, Chlamydia, Trichomonas, Bacterial Vaginosis, Yeast sent to lab Treatment: to be determined once results are received Follow-up as needed if symptoms persist/worsen, or new symptoms develop Informed pap due and to make appt for annual  Mialynn Shelvin  10/19/2023 11:00 AM

## 2023-10-22 LAB — CERVICOVAGINAL ANCILLARY ONLY
Bacterial Vaginitis (gardnerella): POSITIVE — AB
Candida Glabrata: NEGATIVE
Candida Vaginitis: NEGATIVE
Chlamydia: NEGATIVE
Comment: NEGATIVE
Comment: NEGATIVE
Comment: NEGATIVE
Comment: NEGATIVE
Comment: NEGATIVE
Comment: NORMAL
Neisseria Gonorrhea: NEGATIVE
Trichomonas: NEGATIVE

## 2023-10-23 ENCOUNTER — Other Ambulatory Visit: Payer: Self-pay | Admitting: Adult Health

## 2023-10-23 MED ORDER — METRONIDAZOLE 500 MG PO TABS: 500.0000 mg | ORAL_TABLET | Freq: Two times a day (BID) | ORAL | 0 refills | Status: DC

## 2023-10-23 NOTE — Progress Notes (Signed)
+  BV on vaginal swab, will rx flagyl, no sex or alcohol while taking meds

## 2023-10-28 ENCOUNTER — Telehealth: Admitting: Nurse Practitioner

## 2023-10-28 ENCOUNTER — Other Ambulatory Visit: Payer: Self-pay | Admitting: Nurse Practitioner

## 2023-10-28 ENCOUNTER — Telehealth

## 2023-10-28 DIAGNOSIS — J029 Acute pharyngitis, unspecified: Secondary | ICD-10-CM | POA: Diagnosis not present

## 2023-10-28 DIAGNOSIS — B370 Candidal stomatitis: Secondary | ICD-10-CM

## 2023-10-28 MED ORDER — NYSTATIN 100000 UNIT/ML MT SUSP: 5.0000 mL | Freq: Four times a day (QID) | OROMUCOSAL | 0 refills | Status: DC

## 2023-10-28 NOTE — Progress Notes (Signed)
  E-Visit for Sore Throat  We are sorry that you are not feeling well.  Here is how we plan to help!  Your symptoms indicate a likely viral infection (Pharyngitis).   Pharyngitis is inflammation in the back of the throat which can cause a sore throat, scratchiness and sometimes difficulty swallowing.   Pharyngitis is typically caused by a respiratory virus and will just run its course.  Please keep in mind that your symptoms could last up to 10 days.  For throat pain, we recommend over the counter oral pain relief medications such as acetaminophen or aspirin, or anti-inflammatory medications such as ibuprofen or naproxen sodium.  Topical treatments such as oral throat lozenges or sprays may be used as needed.  Avoid close contact with loved ones, especially the very young and elderly.  Remember to wash your hands thoroughly throughout the day as this is the number one way to prevent the spread of infection and wipe down door knobs and counters with disinfectant.  After careful review of your answers, I would not recommend an antibiotic for your condition.  Antibiotics should not be used to treat conditions that we suspect are caused by viruses like the virus that causes the common cold or flu. However, some people can have Strep with atypical symptoms. You may need formal testing in clinic or office to confirm if your symptoms continue or worsen.  Providers prescribe antibiotics to treat infections caused by bacteria. Antibiotics are very powerful in treating bacterial infections when they are used properly.  To maintain their effectiveness, they should be used only when necessary.  Overuse of antibiotics has resulted in the development of super bugs that are resistant to treatment!    Home Care: Scrape tongue thoroughly with tongue scraper Only take medications as instructed by your medical team. Do not drink alcohol while taking these medications. A steam or ultrasonic humidifier can help congestion.   You can place a towel over your head and breathe in the steam from hot water coming from a faucet. Avoid close contacts especially the very young and the elderly. Cover your mouth when you cough or sneeze. Always remember to wash your hands.  Get Help Right Away If: You develop worsening fever or throat pain. You develop a severe head ache or visual changes. Your symptoms persist after you have completed your treatment plan.  Make sure you Understand these instructions. Will watch your condition. Will get help right away if you are not doing well or get worse.   Thank you for choosing an e-visit.  Your e-visit answers were reviewed by a board certified advanced clinical practitioner to complete your personal care plan. Depending upon the condition, your plan could have included both over the counter or prescription medications.  Please review your pharmacy choice. Make sure the pharmacy is open so you can pick up prescription now. If there is a problem, you may contact your provider through [employer] and have the prescription routed to another pharmacy.  Your safety is important to [place]. If you have drug allergies check your prescription carefully.   For the next 24 hours you can use MyChart to ask questions about today's visit, request a non-urgent call back, or ask for a work or school excuse. You will get an email in the next two days asking about your experience. I hope that your e-visit has been valuable and will speed your recovery.

## 2023-10-28 NOTE — Progress Notes (Signed)
 I have spent 5 minutes in review of e-visit questionnaire, review and updating patient chart, medical decision making and response to patient.   Claiborne Rigg, NP

## 2023-10-31 ENCOUNTER — Other Ambulatory Visit: Payer: Self-pay

## 2023-10-31 ENCOUNTER — Ambulatory Visit
Admission: RE | Admit: 2023-10-31 | Discharge: 2023-10-31 | Disposition: A | Discharge status: Home / Self Care | Source: Ambulatory Visit

## 2023-10-31 VITALS — BP 126/91 | HR 80 | Temp 98.2°F | Resp 20

## 2023-10-31 DIAGNOSIS — J029 Acute pharyngitis, unspecified: Secondary | ICD-10-CM | POA: Diagnosis not present

## 2023-10-31 NOTE — Discharge Instructions (Signed)
You have a viral upper respiratory infection.  Symptoms should improve over the next week to 10 days.  If you develop chest pain or shortness of breath, go to the emergency room.  Some things that can make you feel better are: - Increased rest - Increasing fluid with water/sugar free electrolytes - Acetaminophen and ibuprofen as needed for fever/pain - Salt water gargling, chloraseptic spray and throat lozenges for sore throat - OTC guaifenesin (Mucinex) 600 mg twice daily for congestion - Saline sinus flushes or a neti pot - Humidifying the air

## 2023-10-31 NOTE — ED Provider Notes (Signed)
 [facility]    CSN: [id number] Arrival date & time: 10/31/23  0930      History   Chief Complaint Chief Complaint  Patient presents with   Oral Swelling    I have oral thrush but it is not getting better. My throat hurts and my tongue hurts - Entered by patient    HPI Sarah Nichols is a 28 y.o. female.   Patient presents today with 3 to 4-day history of sore throat, mouth pain, slight cough and congestion.  Also endorses runny/stuffy nose.  No fever or back, body aches, chills, chest pain, shortness of breath.  No abdominal pain, nausea/vomiting, or diarrhea.  Reports appetite has been decreased and is difficult to eat because it hurts to swallow.  She did an e-visit on Sunday, was diagnosed with viral pharyngitis, but then responded saying she thought she had thrush and was treated with nystatin.  She reports history of thrush after taking antibiotics a few years ago.  Reports the nystatin has helped a little bit.    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 Authorizing Provider  hydrOXYzine (VISTARIL)  25 MG capsule Take 25 mg by mouth 2 (two) times daily as needed. 09/24/23   [provider]  nystatin (MYCOSTATIN) 100000 UNIT/ML suspension Use as directed 5 mLs (500,000 Units total) in the mouth or throat 4 (four) times daily. Swish and swallow 10/28/23   Claiborne Rigg, NP  prazosin (MINIPRESS) 2 MG capsule Take 2 mg by mouth at bedtime. 08/03/23   [provider]  venlafaxine (EFFEXOR) 37.5 MG tablet Take 37.5 mg by mouth daily. 09/24/23   [provider]  venlafaxine XR (EFFEXOR-XR) 150 MG 24 hr capsule Take 150 mg by mouth daily. 09/24/23   [provider]    Family History Family History  Problem Relation Age of Onset   Gallstones Paternal Grandmother    Cancer Paternal Grandmother        skin   Hypertension Father    Cervical cancer Mother    Depression Mother    Anxiety disorder Mother     Social History Social History   Tobacco Use   Smoking status: Never   Smokeless tobacco: Never  Vaping Use   Vaping status: Never Used  Substance Use Topics   Alcohol  use: Not Currently   Drug use: Not Currently    Types: Marijuana     Allergies   Patient has no known allergies.   Review of Systems Review of Systems Per HPI  Physical Exam Triage Vital Signs ED Triage Vitals  Encounter Vitals Group     BP 10/31/23 0957 (!) 126/91     Systolic BP Percentile --      Diastolic BP Percentile --      Pulse Rate 10/31/23 0957 80     Resp 10/31/23 0957 20     Temp 10/31/23 0957 98.2 F (36.8 C)     Temp Source 10/31/23 0957 Oral     SpO2 10/31/23 0957 98 %     Weight --      Height --      Head Circumference --      Peak Flow --      Pain Score 10/31/23 0955 3     Pain Loc --      Pain Education --      Exclude from Growth Chart --    No data found.  Updated Vital Signs BP (!) 126/91 (BP Location: Right Arm)   Pulse 80   Temp 98.2 F (36.8 C) (Oral)   Resp 20   SpO2 98%   Visual Acuity Right Eye Distance:   Left Eye  Distance:   Bilateral Distance:    Right Eye Near:   Left Eye Near:    Bilateral Near:     Physical Exam Vitals and nursing note reviewed.  Constitutional:      General: She is not in acute distress.    Appearance: Normal appearance. She is not ill-appearing or toxic-appearing.  HENT:     Head: Normocephalic and atraumatic.     Right Ear: Tympanic membrane, ear canal and external ear normal.     Left Ear: Tympanic membrane, ear canal and external ear normal.     Nose: Congestion present. No rhinorrhea.     Mouth/Throat:     Mouth: Mucous membranes are moist. No oral lesions.     Tongue: No lesions.     Pharynx: Oropharynx is clear. Postnasal drip present. No oropharyngeal exudate or posterior oropharyngeal erythema.     Comments: No white plaques on tongue or buccal mucosa Eyes:     General: No scleral icterus.    Extraocular Movements: Extraocular movements intact.  Cardiovascular:     Rate and Rhythm: Normal rate and regular rhythm.  Pulmonary:     Effort: Pulmonary effort is normal. No respiratory distress.     Breath sounds: Normal breath sounds. No wheezing, rhonchi or rales.  Musculoskeletal:     Cervical back: Normal range of motion and neck supple.  Lymphadenopathy:     Cervical: No cervical adenopathy.  Skin:    General: Skin is warm and dry.     Coloration: Skin is not jaundiced or pale.     Findings: No erythema or rash.  Neurological:     Mental Status: She is alert and oriented to person, place, and time.  Psychiatric:        Behavior: Behavior is cooperative.      UC Treatments / Results  Labs (all labs ordered are listed, but only abnormal results are displayed) Labs Reviewed - No data to display  EKG   Radiology No results found.  Procedures Procedures (including critical care time)  Medications Ordered in UC Medications - No data to display  Initial Impression /  Assessment and Plan / UC Course  I have reviewed the triage vital signs and  the nursing notes.  Pertinent labs & imaging results that were available during my care of the patient were reviewed by me and considered in my medical decision making (see chart for details).   Patient is well-appearing, normotensive, afebrile, not tachycardic, not tachypneic, oxygenating well on room air.    1.  Viral pharyngitis We discussed that symptoms are more consistent with a viral pharyngitis and not thrush; I do not see any obvious thrush on exam today Vitals and exam are reassuring Viral testing deferred given length of symptoms Supportive care discussed with patient ER and return precautions discussed Work excuse provided  The patient was given the opportunity to ask questions.  All questions answered to their satisfaction.  The patient is in agreement to this plan.   Final Clinical Impressions(s) / UC Diagnoses   Final diagnoses:  Viral pharyngitis     Discharge Instructions      You have a viral upper respiratory infection.  Symptoms should improve over the next week to 10 days.  If you develop chest pain or shortness of breath, go to the emergency room.  Some things that can make you feel better are: - Increased rest - Increasing fluid with water/sugar free electrolytes - Acetaminophen and ibuprofen as needed for fever/pain - Salt water gargling, chloraseptic spray and throat lozenges for sore throat - OTC guaifenesin (Mucinex) 600 mg twice daily for congestion - Saline sinus flushes or a neti pot - Humidifying the air   ED Prescriptions   None    PDMP not reviewed this encounter.   Valentino Nose, NP 10/31/23 1032

## 2023-10-31 NOTE — ED Triage Notes (Signed)
 Pt reports was dx with oral thrush on Sunday and reports increase mouth pain and reports "tongue feels as if has several cuts on it." Was able to pick of px and is tolerating them and taking as directed.

## 2023-11-12 ENCOUNTER — Other Ambulatory Visit (HOSPITAL_COMMUNITY)
Admission: RE | Admit: 2023-11-12 | Discharge: 2023-11-12 | Disposition: A | Discharge status: Home / Self Care | Source: Ambulatory Visit | Attending: Women's Health | Admitting: Women's Health

## 2023-11-12 ENCOUNTER — Encounter: Payer: Self-pay | Admitting: Women's Health

## 2023-11-12 ENCOUNTER — Ambulatory Visit: Admitting: Women's Health

## 2023-11-12 VITALS — BP 116/80 | HR 88 | Ht 64.0 in | Wt 210.8 lb

## 2023-11-12 DIAGNOSIS — Z01419 Encounter for gynecological examination (general) (routine) without abnormal findings: Secondary | ICD-10-CM

## 2023-11-12 DIAGNOSIS — Z30430 Encounter for insertion of intrauterine contraceptive device: Secondary | ICD-10-CM | POA: Insufficient documentation

## 2023-11-12 NOTE — Progress Notes (Signed)
 WELL-WOMAN EXAMINATION Patient name: Sarah Nichols MRN [id number]  Date of birth: 02/27/1996 Chief Complaint:   Annual Exam  History of Present Illness:   Sarah Nichols is a 28 y.o. G66P1021 Caucasian female being seen today for a routine well-woman exam.  Current complaints: none  [facility]      does not desire labs No LMP recorded. (Menstrual status: IUD). The current method of family planning is IUD, Mirena 07/28/21 Last pap 2021. Results were: NILM w/ HRHPV not done. H/O abnormal pap: no Last mammogram: never. Results were: N/A. Family h/o breast cancer: no Last colonoscopy: never. Results were: N/A. Family h/o colorectal cancer: yes distant relative     11/12/2023   11:42 AM 03/29/2021    9:57 AM 12/08/2020   12:08 PM  Depression screen PHQ 2/9  Decreased Interest 3 1 1   Down, Depressed, Hopeless 1 1 1   PHQ - 2 Score 4 2 2   Altered sleeping 2 2 2   Tired, decreased energy 3 2 3   Change in appetite 1 1 1   Feeling bad or failure about yourself  0 1 1  Trouble concentrating 2 1 2   Moving slowly or fidgety/restless 0 0 3  Suicidal thoughts 0 0 0  PHQ-9 Score 12 9 14         11/12/2023   11:43 AM 03/29/2021    9:58 AM 12/08/2020   12:09 PM  GAD 7 : Generalized Anxiety Score  Nervous, Anxious, on Edge 3 2 3   Control/stop worrying 2 2 2   Worry too much - different things 2 1 2   Trouble relaxing 1 1 1   Restless 3 2 2   Easily annoyed or irritable 3 1 2   Afraid - awful might happen 2 2 2   Total GAD 7 Score 16 11 14      Review of Systems:   Pertinent items are noted in HPI Denies any headaches, blurred vision, fatigue, shortness of breath, chest pain, abdominal pain, abnormal vaginal discharge/itching/odor/irritation, problems with periods, bowel movements, urination, or intercourse unless otherwise stated above. Pertinent History Reviewed:  Reviewed past medical,surgical, social and family history.  Reviewed problem list, medications and  allergies. Physical Assessment:   Vitals:   11/12/23 1139  BP: 116/80  Pulse: 88  Weight: 210 lb 12.8 oz (95.6 kg)  Height: 5\' 4"  (1.626 m)  Body mass index is 36.18 kg/m.        Physical Examination:   General appearance - well appearing, and in no distress  Mental status - alert, oriented to person, place, and time  Psych:  She has a normal mood and affect  Skin - warm and dry, normal color, no suspicious lesions noted  Chest - effort normal, all lung fields clear to auscultation bilaterally  Heart - normal rate and regular rhythm  Neck:  midline trachea, no thyromegaly or nodules  Breasts - breasts appear normal, no suspicious masses, no skin or nipple changes or  axillary nodes  Abdomen - soft, nontender, nondistended, no masses or organomegaly  Pelvic - VULVA: normal appearing vulva with no masses, tenderness or lesions  VAGINA: normal appearing vagina with normal color and discharge, no lesions  CERVIX: normal appearing cervix without discharge or lesions, no CMT  Thin prep pap is done w/ HR HPV cotesting  UTERUS: uterus is felt to be normal size, shape, consistency and nontender   ADNEXA: No adnexal masses or tenderness noted.  Extremities:  No swelling or varicosities noted  Chaperone: Latisha Cresenzo  No results found  for this or any previous visit (from the past 24 hours).  Assessment & Plan:  1) Well-Woman Exam  Labs/procedures today: pap  Mammogram: @ 29yo, or sooner if problems Colonoscopy: @ 29yo, or sooner if problems  No orders of the defined types were placed in this encounter.   Meds: No orders of the defined types were placed in this encounter.   Follow-up: Return in about 1 year (around 11/11/2024) for Physical.  Ferd Householder CNM, [facility] 11/12/2023 12:04 PM

## 2023-11-12 NOTE — Addendum Note (Signed)
 Addended by: Myrl Askew on: 11/12/2023 12:25 PM   Modules accepted: Orders

## 2023-11-15 DIAGNOSIS — F331 Major depressive disorder, recurrent, moderate: Secondary | ICD-10-CM | POA: Diagnosis not present

## 2023-11-15 DIAGNOSIS — F4312 Post-traumatic stress disorder, chronic: Secondary | ICD-10-CM | POA: Diagnosis not present

## 2023-11-15 DIAGNOSIS — F411 Generalized anxiety disorder: Secondary | ICD-10-CM | POA: Diagnosis not present

## 2023-11-15 LAB — CYTOLOGY - PAP
Comment: NEGATIVE
Diagnosis: NEGATIVE
High risk HPV: NEGATIVE

## 2023-11-19 ENCOUNTER — Encounter: Payer: Self-pay | Admitting: Women's Health

## 2024-01-10 DIAGNOSIS — F4312 Post-traumatic stress disorder, chronic: Secondary | ICD-10-CM | POA: Diagnosis not present

## 2024-01-10 DIAGNOSIS — F411 Generalized anxiety disorder: Secondary | ICD-10-CM | POA: Diagnosis not present

## 2024-01-10 DIAGNOSIS — F331 Major depressive disorder, recurrent, moderate: Secondary | ICD-10-CM | POA: Diagnosis not present

## 2024-02-16 ENCOUNTER — Telehealth: Admitting: Nurse Practitioner

## 2024-02-16 DIAGNOSIS — B3731 Acute candidiasis of vulva and vagina: Secondary | ICD-10-CM

## 2024-02-16 MED ORDER — FLUCONAZOLE 150 MG PO TABS: 150.0000 mg | ORAL_TABLET | Freq: Once | ORAL | 0 refills | Status: AC

## 2024-02-16 NOTE — Progress Notes (Signed)

## 2024-02-16 NOTE — Progress Notes (Signed)
 I have spent 5 minutes in review of e-visit questionnaire, review and updating patient chart, medical decision making and response to patient.   Claiborne Rigg, NP

## 2024-03-06 DIAGNOSIS — F4312 Post-traumatic stress disorder, chronic: Secondary | ICD-10-CM | POA: Diagnosis not present

## 2024-03-06 DIAGNOSIS — F331 Major depressive disorder, recurrent, moderate: Secondary | ICD-10-CM | POA: Diagnosis not present

## 2024-03-06 DIAGNOSIS — F411 Generalized anxiety disorder: Secondary | ICD-10-CM | POA: Diagnosis not present

## 2024-03-14 ENCOUNTER — Telehealth: Admitting: Physician Assistant

## 2024-03-14 DIAGNOSIS — B3731 Acute candidiasis of vulva and vagina: Secondary | ICD-10-CM | POA: Diagnosis not present

## 2024-03-14 MED ORDER — FLUCONAZOLE 150 MG PO TABS: 150.0000 mg | ORAL_TABLET | ORAL | 0 refills | Status: AC | PRN

## 2024-03-14 NOTE — Progress Notes (Signed)

## 2024-05-01 DIAGNOSIS — F411 Generalized anxiety disorder: Secondary | ICD-10-CM | POA: Diagnosis not present

## 2024-05-01 DIAGNOSIS — F4312 Post-traumatic stress disorder, chronic: Secondary | ICD-10-CM | POA: Diagnosis not present

## 2024-05-01 DIAGNOSIS — F331 Major depressive disorder, recurrent, moderate: Secondary | ICD-10-CM | POA: Diagnosis not present

## 2024-07-30 DIAGNOSIS — R059 Cough, unspecified: Secondary | ICD-10-CM | POA: Diagnosis not present

## 2024-09-01 ENCOUNTER — Ambulatory Visit: Admitting: Women's Health
